# Patient Record
Sex: Male | Born: 2000
Health system: Southern US, Community
[De-identification: ages and names within clinical notes are randomized; demographics above are authoritative.]

## PROBLEM LIST (undated history)

## (undated) DIAGNOSIS — G43009 Migraine without aura, not intractable, without status migrainosus: Secondary | ICD-10-CM

## (undated) DIAGNOSIS — I10 Essential (primary) hypertension: Secondary | ICD-10-CM

## (undated) DIAGNOSIS — J302 Other seasonal allergic rhinitis: Secondary | ICD-10-CM

## (undated) HISTORY — PX: CIRCUMCISION: SUR203

## (undated) HISTORY — DX: Migraine without aura, not intractable, without status migrainosus: G43.009

## (undated) HISTORY — DX: Essential (primary) hypertension: I10

---
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
## 2018-04-11 IMAGING — CT CT ANGIO NECK
2 of 7 series · 8 of 33 positions shown · IV contrast (OMNI 350)
Comparison: Noncontrast head CT earlier today.

CLINICAL DATA: Patient fell off a treadmill. RIGHT-sided headache
with LEFT-sided weakness.

EXAM:
CT ANGIOGRAPHY HEAD AND NECK
TECHNIQUE: Multidetector CT imaging of the head and neck was performed using
the standard protocol during bolus administration of intravenous
contrast. Multiplanar CT image reconstructions and MIPs were
obtained to evaluate the vascular anatomy. Carotid stenosis
measurements (when applicable) are obtained utilizing NASCET
criteria, using the distal internal carotid diameter as the
denominator.
CONTRAST:  Isovue 370, 50 mL.

[Series 5: cta neck · axial · 0.58mm/px · z∈[-276,-152]mm · 2 of 188 slices shown]
[im 63/188  soft-tissue]
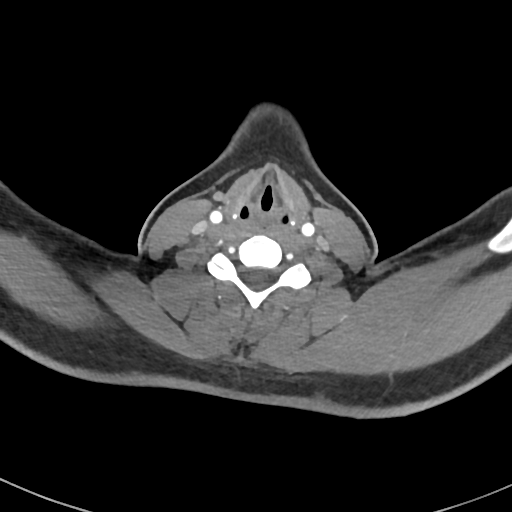
[im 125/188  soft-tissue]
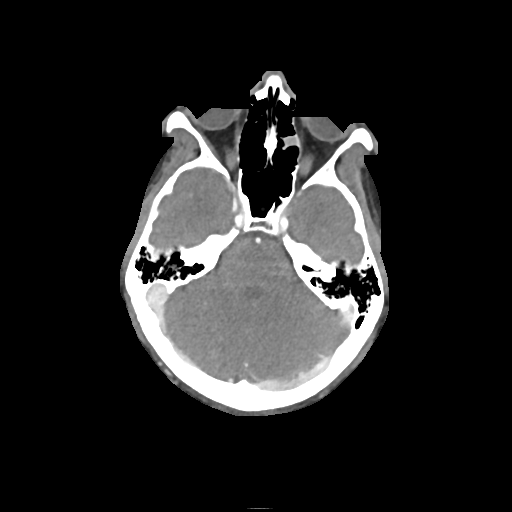

[Series 7: cta neck axial · axial · 0.39mm/px · z∈[-347,-82]mm · 6 of 372 slices shown]
[im 54/372  soft-tissue]
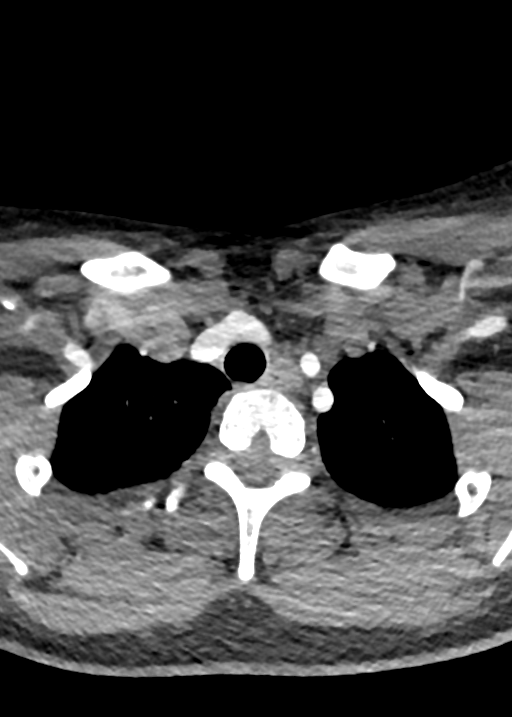
[im 107/372  bone]
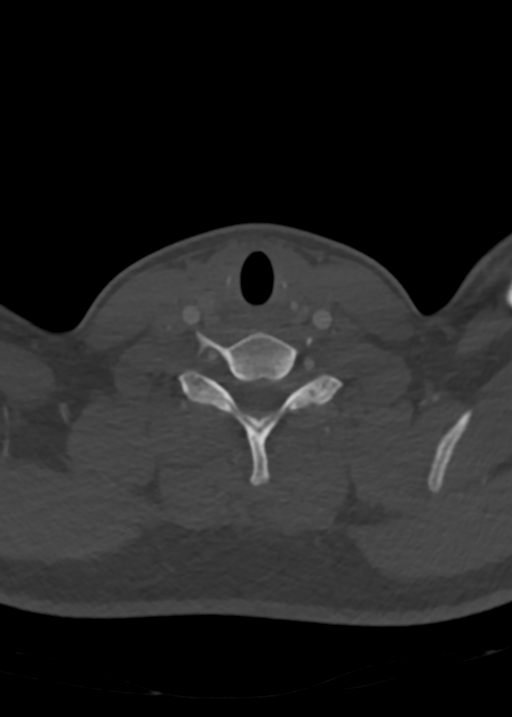
[im 160/372  soft-tissue]
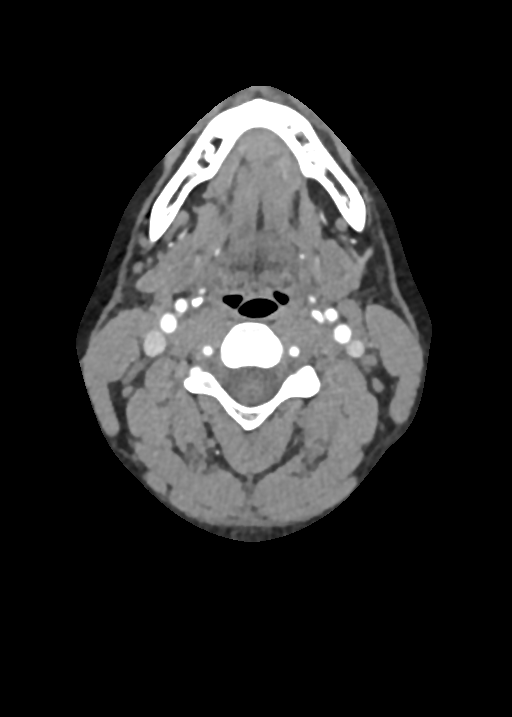
[im 213/372  bone]
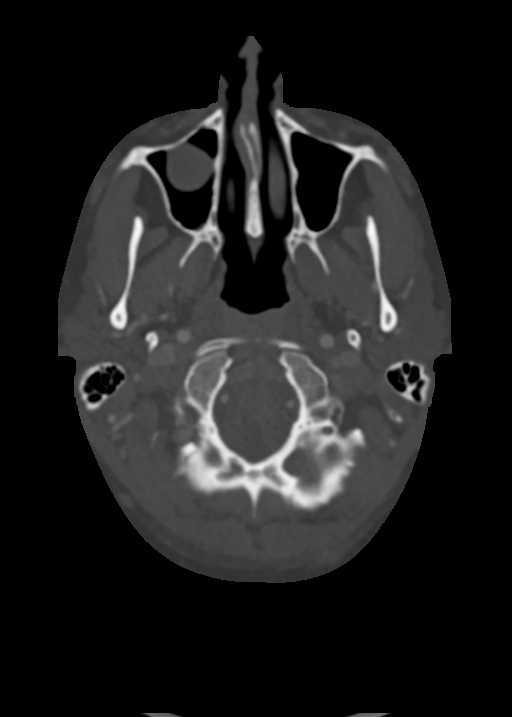
[im 266/372  soft-tissue]
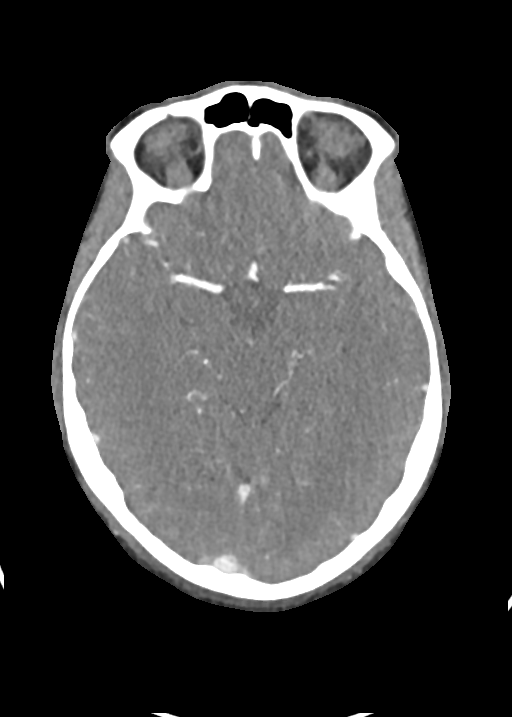
[im 319/372  bone]
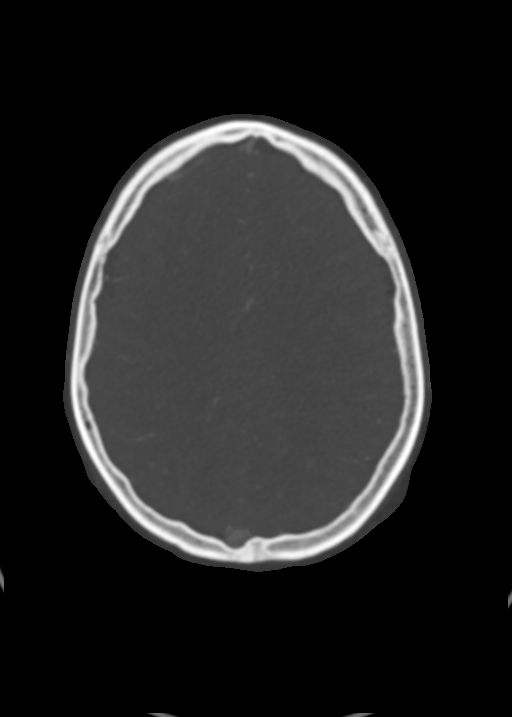

[8 of 33 positions shown; findings below may reference images not displayed]

FINDINGS: CT HEAD

Calvarium and skull base: No fracture or destructive lesion.
Mastoids and middle ears are grossly clear.

Paranasal sinuses: Imaged portions are clear.

Orbits: Negative.

Brain: No evidence of acute abnormality, including acute infarct,
hemorrhage, hydrocephalus, or mass lesion.

CTA NECK

Aortic arch: Standard branching. Imaged portion shows no evidence of
aneurysm or dissection. No significant stenosis of the major arch
vessel origins.

Right carotid system: No evidence of dissection, stenosis (50% or
greater) or occlusion.

Left carotid system: No evidence of dissection, stenosis (50% or
greater) or occlusion.

Vertebral arteries: Codominant. No evidence of dissection, stenosis
(50% or greater) or occlusion.

Nonvascular soft tissues: Normal cervical spinal alignment. No
osseous findings. Lung apices clear. No neck masses.

CTA HEAD

Anterior circulation: Moderate irregularity of the distal MCA
branches bilaterally. No significant stenosis, proximal occlusion,
aneurysm, or vascular malformation.

Posterior circulation: Moderate irregularity of distal PCA branches
bilaterally. No significant stenosis, proximal occlusion, aneurysm,
or vascular malformation.

Venous sinuses: As permitted by contrast timing, patent.

Anatomic variants: Fetal LEFT PCA.

Delayed phase:   No abnormal intracranial enhancement.
IMPRESSION: Moderate irregularity of the distal MCA and PCA branches
bilaterally. No proximal large vessel occlusion or carotid
dissection.

RCVS (reversible cerebral vasoconstrictive syndrome) not excluded.

These results were called by telephone at the time of interpretation
on 08/09/2015 at [DATE] to Dr. YOUSHIN DYAKOPU , who verbally
acknowledged these results.

## 2018-04-11 IMAGING — MR MR HEAD WO/W CM
11 of 16 series · 28 of 48 positions shown · IV contrast (multihance)
Comparison: CTA head and neck today.

CLINICAL DATA: Headache. Left-sided weakness and facial droop. Rule
out stroke.

EXAM:
MRI HEAD WITHOUT AND WITH CONTRAST
MRA HEAD WITHOUT CONTRAST
TECHNIQUE: Multiplanar, multiecho pulse sequences of the brain and surrounding
structures were obtained without and with intravenous contrast.
Angiographic images of the head were obtained using MRA technique
without contrast.
CONTRAST:  15mL MULTIHANCE GADOBENATE DIMEGLUMINE 529 MG/ML IV SOLN

[Series 3: DWI · axial · 3.6mm · 0.94mm/px · z∈[-90,+51]mm · 6 of 82 slices shown (1 of 4)]
[im 1/82]
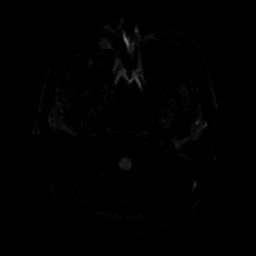
[im 17/82]
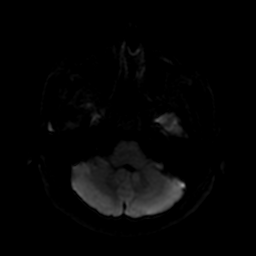
[im 33/82]
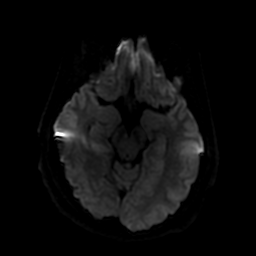
[im 49/82]
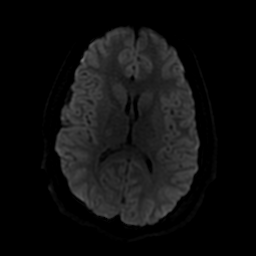
[im 65/82]
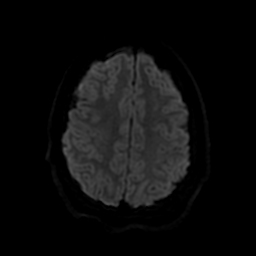
[im 82/82]
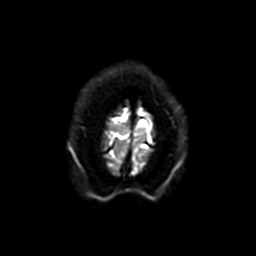

[Series 4: T2 · axial · 5.0mm · 0.47mm/px · z∈[-90,+51]mm · 2 of 25 slices shown]
[im 1/25]
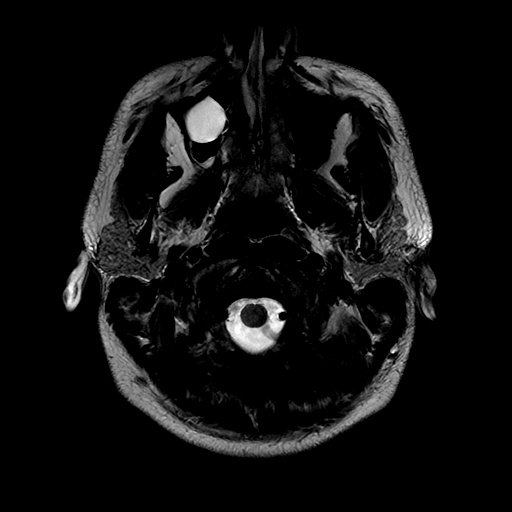
[im 25/25]
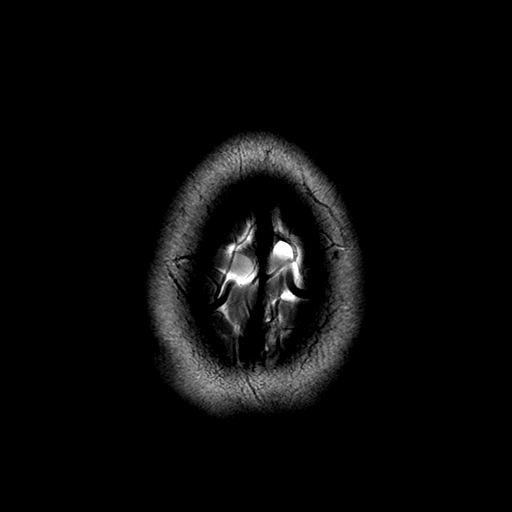

[Series 5: FLAIR · sagittal · 5.0mm · 0.47mm/px · 1 of 23 slices shown (1 of 2)]
[im 1/23]
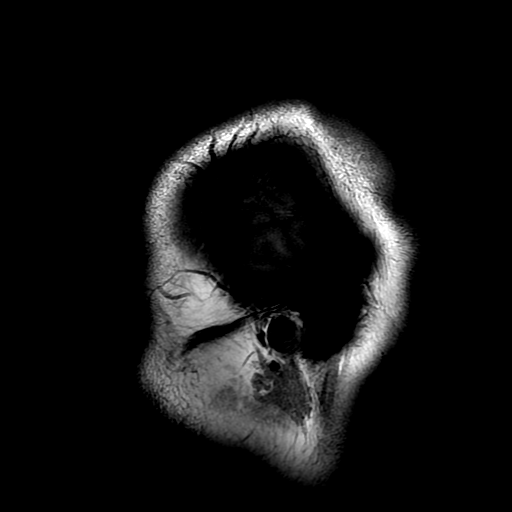

[Series 7: ax (id) 2 · axial · 1.2mm · 0.43mm/px · z∈[-93,-22]mm · 5 of 200 slices shown]
[im 1/200]
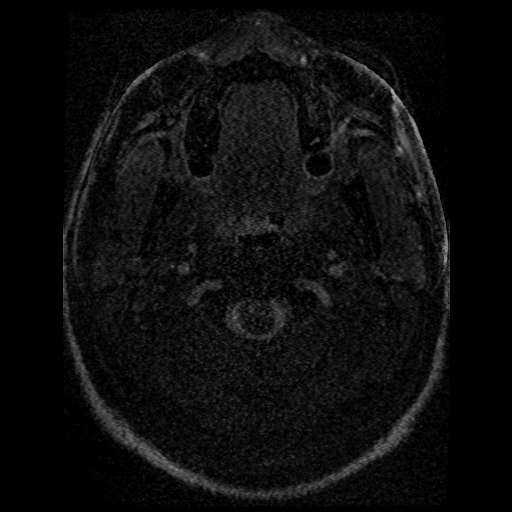
[im 40/200]
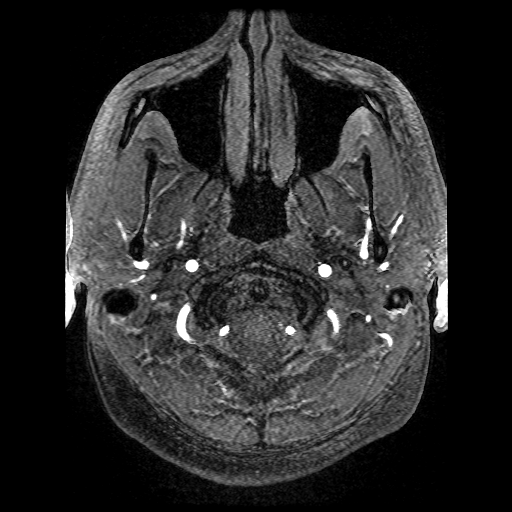
[im 60/200]
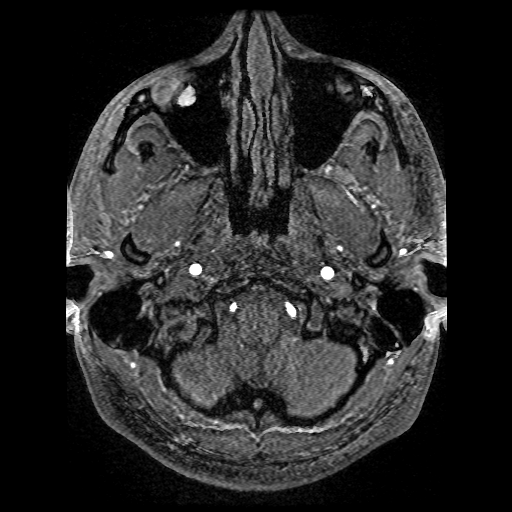
[im 80/200]
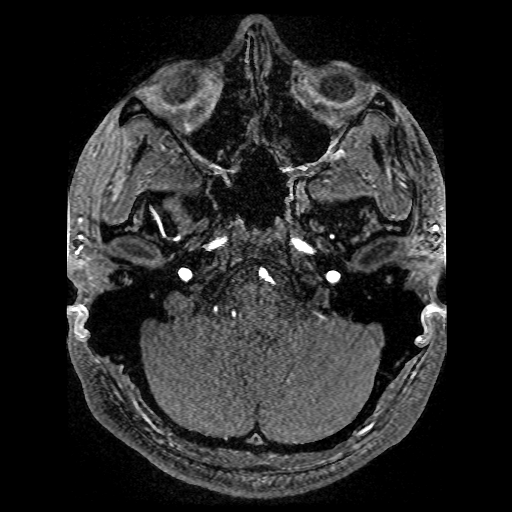
[im 120/200]
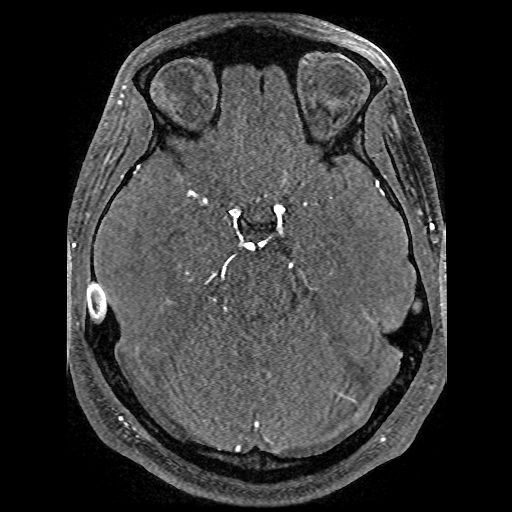

[Series 9: FLAIR · axial · 5.0mm · 0.47mm/px · 1 of 25 slices shown (2 of 2)]
[im 1/25]
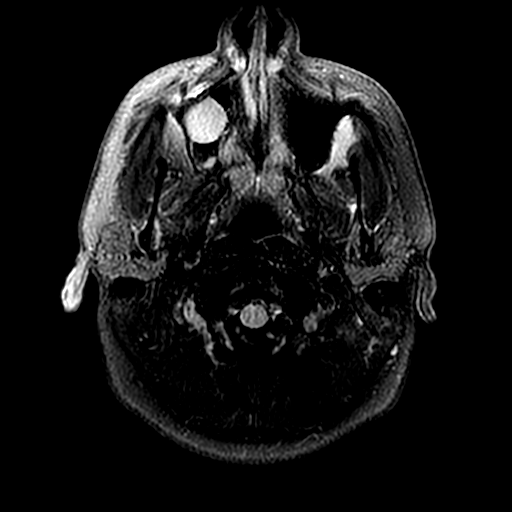

[Series 10: DWI · coronal · 5.0mm · 0.94mm/px · 4 of 74 slices shown (2 of 4)]
[im 1/74]
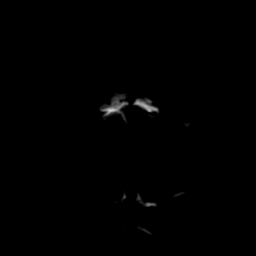
[im 25/74]
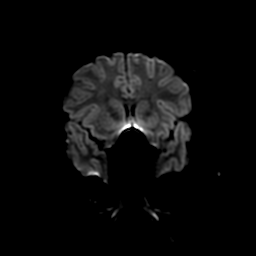
[im 49/74]
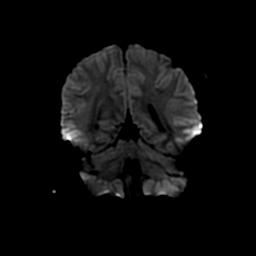
[im 74/74]
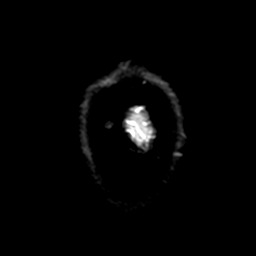

[Series 13: T2 post-contrast · coronal · 5.0mm · 0.39mm/px · 2 of 31 slices shown]
[im 1/31]
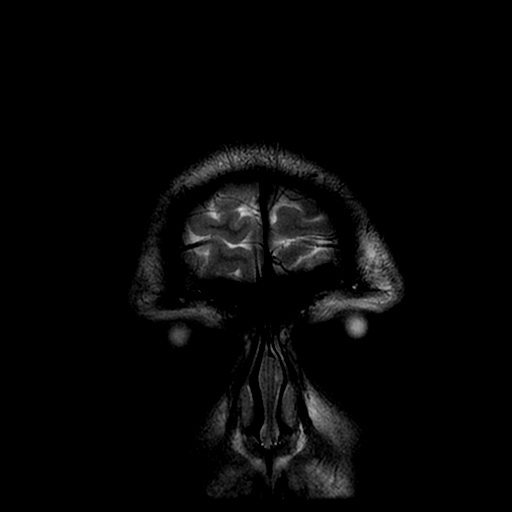
[im 31/31]
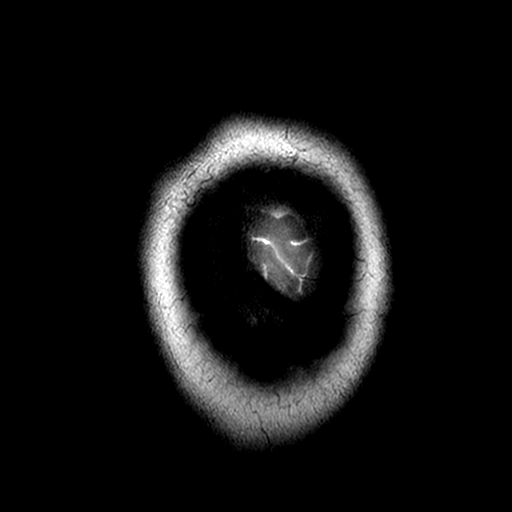

[Series 15: T1 · axial · 5.0mm · 0.47mm/px · 1 of 25 slices shown (1 of 2)]
[im 1/25]
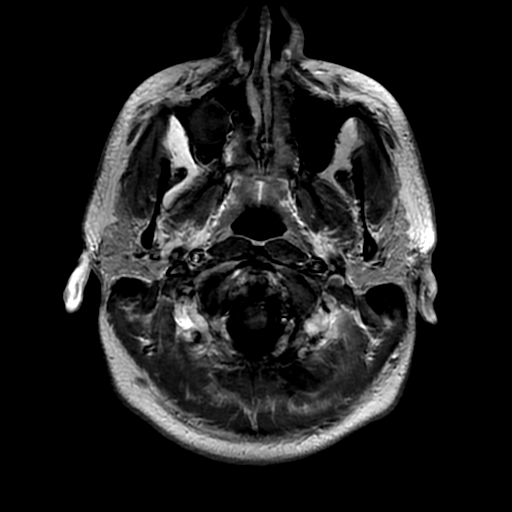

[Series 16: T1 · coronal · 5.0mm · 0.43mm/px · 2 of 31 slices shown (2 of 2)]
[im 1/31]
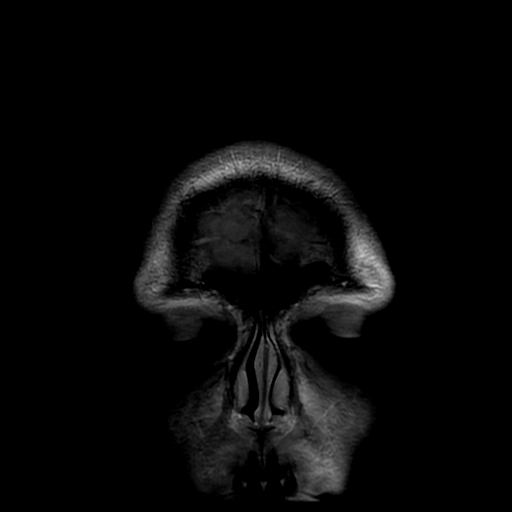
[im 31/31]
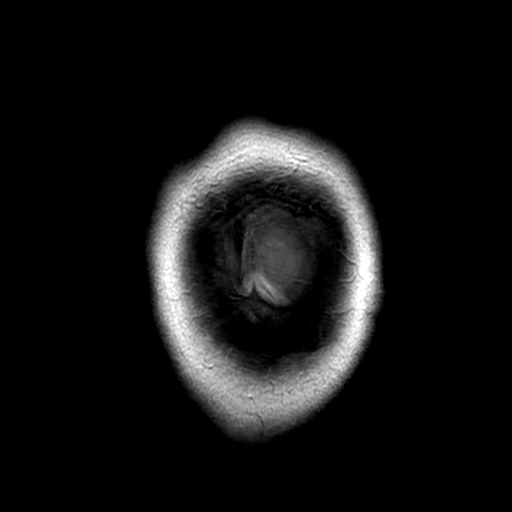

[Series 300: DWI · axial · 3.6mm · 0.94mm/px · z∈[-90,+51]mm · 2 of 41 slices shown (3 of 4)]
[im 1/41]
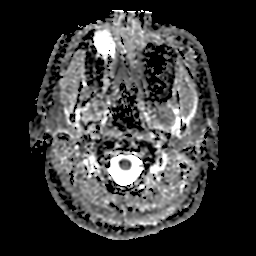
[im 41/41]
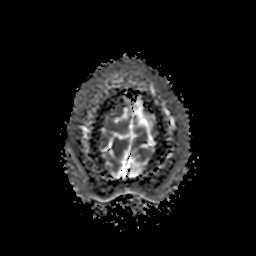

[Series 1000: DWI · coronal · 5.0mm · 0.94mm/px · 2 of 37 slices shown (4 of 4)]
[im 1/37]
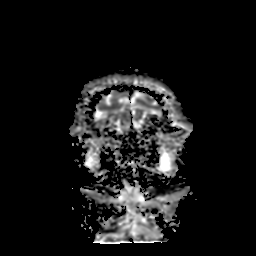
[im 37/37]
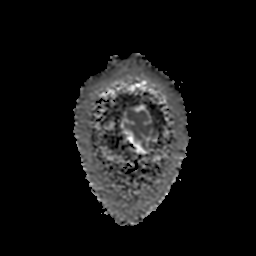

[28 of 48 positions shown; findings below may reference images not displayed]

FINDINGS: MRI HEAD FINDINGS

Ventricle size normal.  Cerebral volume normal.

Negative for acute or chronic infarction.

Negative for demyelinating disease.

Negative for intracranial hemorrhage. No subarachnoid hemorrhage
identified.

Negative for mass or edema. No cortical edema. No white matter
edema. No shift of the midline structures.

Postcontrast imaging demonstrates normal enhancement. Normal
vascular enhancement. No enhancing mass. Leptomeningeal enhancement
is normal.

Mucous retention cyst right maxillary sinus. Remaining sinuses
clear. Normal orbit and pituitary. Skull base normal.

MRA HEAD FINDINGS

Both vertebral arteries patent to the basilar. PICA patent
bilaterally. Left AICA patent. Basilar widely patent. Superior
cerebellar and posterior cerebral arteries patent bilaterally.
Posterior communicating artery patent bilaterally.

Cavernous carotid widely patent bilaterally. Anterior and middle
cerebral arteries widely patent bilaterally. Mild irregularity of
the middle cerebral artery branches noted on CTA earlier today is
not appreciated on the MRA. MRA is of good quality raising the
possibility of artifact on the CTA.

Negative for cerebral aneurysm.
IMPRESSION: Normal MRI of the brain with contrast

Normal MRA head.

## 2018-06-02 DIAGNOSIS — J069 Acute upper respiratory infection, unspecified: Secondary | ICD-10-CM | POA: Diagnosis not present

## 2018-06-02 DIAGNOSIS — J0140 Acute pansinusitis, unspecified: Secondary | ICD-10-CM | POA: Diagnosis not present

## 2018-09-08 DIAGNOSIS — M25571 Pain in right ankle and joints of right foot: Secondary | ICD-10-CM | POA: Diagnosis not present

## 2018-09-08 DIAGNOSIS — S93401A Sprain of unspecified ligament of right ankle, initial encounter: Secondary | ICD-10-CM | POA: Diagnosis not present

## 2019-02-19 DIAGNOSIS — T1501XA Foreign body in cornea, right eye, initial encounter: Secondary | ICD-10-CM | POA: Diagnosis not present

## 2019-02-20 DIAGNOSIS — S0501XD Injury of conjunctiva and corneal abrasion without foreign body, right eye, subsequent encounter: Secondary | ICD-10-CM | POA: Diagnosis not present

## 2019-04-27 NOTE — Progress Notes (Signed)
Complete Physical  Assessment and Plan:  Screening cholesterol level -     Lipid panel  Encounter for general adult medical examination with abnormal findings 1 year  Atypical migraine Has not had since 2017, had very good work up Will monitor Pt states his HR is normal 50-60's will avoid BB for now  Essential hypertension Denies symptoms of OSA, ? Abnormal brachial/femoral pulse on the right, next OV get leg blood pressures to rule out aortic coarctation.  Do dash diet, will treat with low dose losartan 50 mg Monitor BP at home -     CBC with Differential/Platelet -     COMPLETE METABOLIC PANEL WITH GFR -     TSH -     Urinalysis, Routine w reflex microscopic -     Microalbumin / creatinine urine ratio -     EKG 12-Lead -     DG Chest 2 View; Future -     losartan (COZAAR) 50 MG tablet; Take 1 tablet (50 mg total) by mouth daily.  Screening for hematuria or proteinuria Check for proteinuria with BP  Medication management -     CBC with Differential/Platelet -     COMPLETE METABOLIC PANEL WITH GFR -     TSH -     Magnesium  Vitamin D deficiency -     VITAMIN D 25 Hydroxy (Vit-D Deficiency, Fractures)  Screening, anemia, deficiency, iron -     Iron,Total/Total Iron Binding Cap -     Ferritin -     Vitamin B12  Screening for cardiovascular condition -     EKG 12-Lead  History of COVID-19 -     SAR CoV2 Serology (COVID 19)AB(IGG)IA  Need for Tdap vaccination -     Tdap vaccine greater than or equal to 7yo IM  Screening for diabetes mellitus -     Hemoglobin A1c  Plantar wart right foot Come back for removal  Discussed med's effects and SE's. Screening labs and tests as requested with regular follow-up as recommended. Over 40 minutes of exam, counseling, chart review and critical decision making was performed  HPI Patient presents to establish as a new patient.  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    He has concerns about his BP, BP has been elevated x 2017, has been 144/94.  He sleeps well at night, no frequent awaking's, no snoring, wakes up rested.  No headaches.    He complains of pain on his foot x6 months. Has place on right foot, under 2nd and 3rd toes, hard dense skin in a ring that hurts. Worse without shoes on. He tried acid OTC stuff that did not help.  He is due for his TDAP.   He has a history of atypical migraine in the past and has seen Dr. Sharene Skeans, ped neuro in 2017.  In 2017, he had left sided hemiplegia and hemiparesis with a headache, had negative head CT, CT angio neck/head, and MRI/MRA. Negative coagulation study and thrombophilia work up at that time, determined to be a complex migraine.  He had a normal B12 at that time. Normal echo.   He does workout. He denies chest pain, shortness of breath, dizziness.  He is not on cholesterol medication and denies myalgias. The cholesterol last visit was:   Lab Results  Component Value Date   CHOL 109 08/09/2015   HDL 39 (L) 08/09/2015   LDLCALC 62 08/09/2015   TRIG 41 08/09/2015  CHOLHDL 2.8 08/09/2015    Current Medications:  No current outpatient medications on file prior to visit.   No current facility-administered medications on file prior to visit.   Allergies:  Allergies  Allergen Reactions  . Penicillins Rash   Health Maintenance:   There is no immunization history on file for this patient.  Tetanus: Pneumovax: Prevnar 13: Flu vaccine: Zostavax:  DEXA: Colonoscopy: EGD: Eye Exam: Dentist:  Patient Care Team: Letitia Libra, MD as PCP - General (Pediatrics)  Medical History:  has Weakness; Other vascular headache; Acute left-sided weakness; Left sided numbness; and Atypical migraine on their problem list. Surgical History:  He  has a past surgical history that includes Circumcision. Family History:  His family history includes Cancer in his paternal grandfather and paternal  grandmother; Hyperlipidemia in his father; Hypertension in his mother; Stroke in his paternal grandfather. Social History:   reports that he has never smoked. He has never used smokeless tobacco. He reports that he does not drink alcohol or use drugs. Review of Systems:  Review of Systems  Constitutional: Negative.   HENT: Negative.   Respiratory: Negative.   Cardiovascular: Negative.   Gastrointestinal: Negative.   Genitourinary: Negative.   Musculoskeletal: Negative.   Skin: Positive for rash.  Neurological: Negative.   Psychiatric/Behavioral: Negative.     Physical Exam: Estimated body mass index is 31.38 kg/m as calculated from the following:   Height as of 08/24/15: 5' 11.25" (1.81 m).   Weight as of 08/24/15: 226 lb 9.6 oz (102.8 kg). There were no vitals taken for this visit. General Appearance: Well nourished, in no apparent distress.  Eyes: PERRLA, EOMs, conjunctiva no swelling or erythema, normal fundi and vessels.  Sinuses: No Frontal/maxillary tenderness  ENT/Mouth: Ext aud canals clear, normal light reflex with TMs without erythema, bulging. Good dentition. No erythema, swelling, or exudate on post pharynx. Tonsils not swollen or erythematous. Hearing normal.  Neck: Supple, thyroid normal. No bruits  Respiratory: Respiratory effort normal, BS equal bilaterally without rales, rhonchi, wheezing or stridor.  Cardio: RRR without murmurs, rubs or gallops. Brisk peripheral pulses without edema, delayed right TP pulse comparatively. .  Chest: symmetric, with normal excursions and percussion.  Abdomen: Soft, nontender, no guarding, rebound, hernias, masses, or organomegaly.  Lymphatics: Non tender without lymphadenopathy.  Genitourinary:  Musculoskeletal: Full ROM all peripheral extremities,5/5 strength, and normal gait.  Skin: Right foot at 2nd-3rd toe with tender circular calus. Warm, dry without rashes,  ecchymosis. Neuro: Cranial nerves intact, reflexes equal bilaterally.  Normal muscle tone, no cerebellar symptoms. Sensation intact.  Psych: Awake and oriented X 3, normal affect, Insight and Judgment appropriate.   EKG: WNL no changes. AORTA SCAN: WNL  Vicie Mutters 8:52 AM [facility]

## 2019-04-29 ENCOUNTER — Ambulatory Visit (INDEPENDENT_AMBULATORY_CARE_PROVIDER_SITE_OTHER): Payer: BC Managed Care – PPO | Admitting: Physician Assistant

## 2019-04-29 ENCOUNTER — Other Ambulatory Visit: Payer: Self-pay

## 2019-04-29 ENCOUNTER — Encounter: Payer: Self-pay | Admitting: Physician Assistant

## 2019-04-29 VITALS — BP 120/100 | HR 70 | Temp 97.9°F | Ht 74.0 in | Wt 271.0 lb

## 2019-04-29 DIAGNOSIS — I10 Essential (primary) hypertension: Secondary | ICD-10-CM | POA: Insufficient documentation

## 2019-04-29 DIAGNOSIS — Z136 Encounter for screening for cardiovascular disorders: Secondary | ICD-10-CM | POA: Diagnosis not present

## 2019-04-29 DIAGNOSIS — Z131 Encounter for screening for diabetes mellitus: Secondary | ICD-10-CM

## 2019-04-29 DIAGNOSIS — E559 Vitamin D deficiency, unspecified: Secondary | ICD-10-CM | POA: Diagnosis not present

## 2019-04-29 DIAGNOSIS — Z130 Encounter for screening for diseases of the blood and blood-forming organs and certain disorders involving the immune mechanism: Secondary | ICD-10-CM

## 2019-04-29 DIAGNOSIS — B070 Plantar wart: Secondary | ICD-10-CM

## 2019-04-29 DIAGNOSIS — Z0001 Encounter for general adult medical examination with abnormal findings: Secondary | ICD-10-CM

## 2019-04-29 DIAGNOSIS — Z8616 Personal history of COVID-19: Secondary | ICD-10-CM

## 2019-04-29 DIAGNOSIS — Z1389 Encounter for screening for other disorder: Secondary | ICD-10-CM | POA: Diagnosis not present

## 2019-04-29 DIAGNOSIS — G43009 Migraine without aura, not intractable, without status migrainosus: Secondary | ICD-10-CM

## 2019-04-29 DIAGNOSIS — Z23 Encounter for immunization: Secondary | ICD-10-CM | POA: Diagnosis not present

## 2019-04-29 DIAGNOSIS — Z13220 Encounter for screening for lipoid disorders: Secondary | ICD-10-CM

## 2019-04-29 DIAGNOSIS — Z79899 Other long term (current) drug therapy: Secondary | ICD-10-CM

## 2019-04-29 MED ORDER — LOSARTAN POTASSIUM 50 MG PO TABS: 50.0000 mg | ORAL_TABLET | Freq: Every day | ORAL | 1 refills | Status: DC

## 2019-04-29 NOTE — Patient Instructions (Addendum)
HYPERTENSION INFORMATION  Monitor your blood pressure at home, please keep a record and bring that in with you to your next office visit.   Go to the ER if any CP, SOB, nausea, dizziness, severe HA, changes vision/speech  Testing/Procedures: HOW TO TAKE YOUR BLOOD PRESSURE:  Rest 5 minutes before taking your blood pressure.  Don't smoke or drink caffeinated beverages for at least 30 minutes before.  Take your blood pressure before (not after) you eat.  Sit comfortably with your back supported and both feet on the floor (don't cross your legs).  Elevate your arm to heart level on a table or a desk.  Use the proper sized cuff. It should fit smoothly and snugly around your bare upper arm. There should be enough room to slip a fingertip under the cuff. The bottom edge of the cuff should be 1 inch above the crease of the elbow.  Due to a recent study, SPRINT, we have changed our goal for the systolic or top blood pressure number. Ideally we want your top number at 120.  In the [facility] Trial, 5000 people were randomized to a goal BP of 120 and 5000 people were randomized to a goal BP of less than 140. The patients with the goal BP at 120 had LESS DEMENTIA, LESS HEART ATTACKS, AND LESS STROKES, AS WELL AS OVERALL DECREASED MORTALITY OR DEATH RATE.   There was another study that showed taking your blood pressure medications at night decrease cardiovascular events.  However if you are on a fluid pill, please take this in the morning.   If you are willing, our goal BP is the top number of 120.  Your most recent BP: BP: (!) 120/100   Take your medications faithfully as instructed. Maintain a healthy weight. Get at least 150 minutes of aerobic exercise per week. Minimize salt intake. Minimize alcohol intake  DASH Eating Plan DASH stands for "Dietary Approaches to Stop Hypertension." The DASH eating plan is a healthy eating plan that has been shown to reduce high blood pressure (hypertension).  Additional health benefits may include reducing the risk of type 2 diabetes mellitus, heart disease, and stroke. The DASH eating plan may also help with weight loss. WHAT DO I NEED TO KNOW ABOUT THE DASH EATING PLAN? For the DASH eating plan, you will follow these general guidelines:  Choose foods with a percent daily value for sodium of less than 5% (as listed on the food label).  Use salt-free seasonings or herbs instead of table salt or sea salt.  Check with your health care provider or pharmacist before using salt substitutes.  Eat lower-sodium products, often labeled as "lower sodium" or "no salt added."  Eat fresh foods.  Eat more vegetables, fruits, and low-fat dairy products.  Choose whole grains. Look for the word "whole" as the first word in the ingredient list.  Choose fish and skinless chicken or [place] more often than red meat. Limit fish, poultry, and meat to 6 oz (170 g) each day.  Limit sweets, desserts, sugars, and sugary drinks.  Choose heart-healthy fats.  Limit cheese to 1 oz (28 g) per day.  Eat more home-cooked food and less restaurant, buffet, and fast food.  Limit fried foods.  Cook foods using methods other than frying.  Limit canned vegetables. If you do use them, rinse them well to decrease the sodium.  When eating at a restaurant, ask that your food be prepared with less salt, or no salt if possible. WHAT FOODS  CAN I EAT? Seek help from a dietitian for individual calorie needs. Grains Whole grain or whole wheat bread. Brown rice. Whole grain or whole wheat pasta. Quinoa, bulgur, and whole grain cereals. Low-sodium cereals. Corn or whole wheat flour tortillas. Whole grain cornbread. Whole grain crackers. Low-sodium crackers. Vegetables Fresh or frozen vegetables (raw, steamed, roasted, or grilled). Low-sodium or reduced-sodium tomato and vegetable juices. Low-sodium or reduced-sodium tomato sauce and paste. Low-sodium or reduced-sodium canned  vegetables.  Fruits All fresh, canned (in natural juice), or frozen fruits. Meat and Other Protein Products Ground beef (85% or leaner), grass-fed beef, or beef trimmed of fat. Skinless chicken or [place]. Ground chicken or [place]. Pork trimmed of fat. All fish and seafood. Eggs. Dried beans, peas, or lentils. Unsalted nuts and seeds. Unsalted canned beans. Dairy Low-fat dairy products, such as skim or 1% milk, 2% or reduced-fat cheeses, low-fat ricotta or cottage cheese, or plain low-fat yogurt. Low-sodium or reduced-sodium cheeses. Fats and Oils Tub margarines without trans fats. Light or reduced-fat mayonnaise and salad dressings (reduced sodium). Avocado. Safflower, olive, or canola oils. Natural peanut or almond butter. Other Unsalted popcorn and pretzels. The items listed above may not be a complete list of recommended foods or beverages. Contact your dietitian for more options. WHAT FOODS ARE NOT RECOMMENDED? Grains White bread. White pasta. White rice. Refined cornbread. Bagels and croissants. Crackers that contain trans fat. Vegetables Creamed or fried vegetables. Vegetables in a cheese sauce. Regular canned vegetables. Regular canned tomato sauce and paste. Regular tomato and vegetable juices. Fruits Dried fruits. Canned fruit in light or heavy syrup. Fruit juice. Meat and Other Protein Products Fatty cuts of meat. Ribs, chicken wings, bacon, sausage, bologna, salami, chitterlings, fatback, hot dogs, bratwurst, and packaged luncheon meats. Salted nuts and seeds. Canned beans with salt. Dairy Whole or 2% milk, cream, half-and-half, and cream cheese. Whole-fat or sweetened yogurt. Full-fat cheeses or blue cheese. Nondairy creamers and whipped toppings. Processed cheese, cheese spreads, or cheese curds. Condiments Onion and garlic salt, seasoned salt, table salt, and sea salt. Canned and packaged gravies. Worcestershire sauce. Tartar sauce. Barbecue sauce. Teriyaki sauce. Soy sauce,  including reduced sodium. Steak sauce. Fish sauce. Oyster sauce. Cocktail sauce. Horseradish. Ketchup and mustard. Meat flavorings and tenderizers. Bouillon cubes. Hot sauce. Tabasco sauce. Marinades. Taco seasonings. Relishes. Fats and Oils Butter, stick margarine, lard, shortening, ghee, and bacon fat. Coconut, palm kernel, or palm oils. Regular salad dressings. Other Pickles and olives. Salted popcorn and pretzels. The items listed above may not be a complete list of foods and beverages to avoid. Contact your dietitian for more information. WHERE CAN I FIND MORE INFORMATION? National Heart, Lung, and Blood Institute: [employer] Document Released: 03/08/2011 Document Revised: 08/03/2013 Document Reviewed: 01/21/2013 [facility] Patient Information 2015 [place], [place]. This information is not intended to replace advice given to you by your health care provider. Make sure you discuss any questions you have with your health care provider.  Plantar Warts Warts are small growths on the skin. When they occur on the underside (sole) of the foot, they are called plantar warts. Plantar warts often occur in groups, with several small warts around a larger wart. They tend to develop on the heel or the ball of the foot. They may grow into the deeper layers of skin or rise above the surface of the skin. Most warts are not painful, and they usually do not cause problems. However, plantar warts may cause pain when you walk because pressure is applied to them. Plantar  warts may spread to other areas of the sole. They can also spread to other areas of the body through direct and indirect contact. Warts often go away on their own in time. Various treatments may be done if needed or desired. What are the causes? Plantar warts are caused by a type of virus that is called human papillomavirus (HPV).  Walking barefoot can cause exposure to the virus, especially if your feet are  wet.  HPV attacks a break in the skin of the foot. What increases the risk? You are more likely to develop this condition if you:  Are between 9-80 years of age.  Use public showers or locker rooms.  Have a weakened body defense system (immune system). What are the signs or symptoms? Common symptoms of this condition include:  Flat or slightly raised growths that have a rough surface and look similar to a callus.  Pain when you use your foot to support your body weight. How is this diagnosed? A plantar wart can usually be diagnosed from its appearance. In some cases, a tissue sample may be removed (biopsy) to be looked at under a microscope. How is this treated? In many cases, warts do not need treatment. Without treatment, they often go away with time. If treatment is needed or desired, options may include:  Applying medicated solutions, creams, or patches to the wart. These may be over-the-counter or prescription medicines that make the skin soft so that layers will gradually shed away. In many cases, the medicine is applied one or two times a day and covered with a bandage.  Freezing the wart with liquid nitrogen (cryotherapy).  Burning the wart with: ? Laser treatment. ? An electrified probe (electrocautery).  Injecting a medicine (Candida antigen) into the wart to help the body's immune system fight off the wart.  Having surgery to remove the wart.  Putting duct tape over the top of the wart (occlusion). You will leave the tape in place for as long as told by your health care provider, and then you will replace it with a new strip of tape. This is done until the wart goes away. Repeat treatment may be needed if you choose to remove warts. Warts sometimes go away and come back again. Follow these instructions at home:  Apply medicated creams or solutions only as told by your health care provider. This may involve: ? Soaking the affected area in warm water. ? Removing the  top layer of softened skin before you apply the medicine. A pumice stone works well for removing the skin. ? Applying a bandage over the affected area after you apply the medicine. ? Repeating the process daily or as told by your health care provider.  Do not scratch or pick at a wart.  Wash your hands after you touch a wart.  If a wart is painful, try covering it with a bandage that has a hole in the middle. This helps to take pressure off the wart.  Keep all follow-up visits as told by your health care provider. This is important. How is this prevented? Take these actions to help prevent warts:  Wear shoes and socks. Change your socks daily.  Keep your feet clean and dry.  Do not walk barefoot in shared locker rooms, shower areas, or swimming pools.  Check your feet regularly.  Avoid direct contact with warts on other people. Contact a health care provider if:  Your warts do not improve after treatment.  You have redness, swelling,  or pain at the site of a wart.  You have bleeding from a wart that does not stop with light pressure.  You have diabetes and you develop a wart. Summary  Warts are small growths on the skin. When they occur on the underside (sole) of the foot, they are called plantar warts.  In many cases, warts do not need treatment. Without treatment, they often go away with time.  Apply medicated creams or solutions only as told by your health care provider.  Do not scratch or pick at a wart. Wash your hands after you touch a wart.  Keep all follow-up visits as told by your health care provider. This is important. This information is not intended to replace advice given to you by your health care provider. Make sure you discuss any questions you have with your health care provider. Document Revised: 10/15/2017 Document Reviewed: 10/15/2017 Elsevier Patient Education  2020 [employer].

## 2019-04-30 DIAGNOSIS — Z79899 Other long term (current) drug therapy: Secondary | ICD-10-CM | POA: Insufficient documentation

## 2019-04-30 DIAGNOSIS — E559 Vitamin D deficiency, unspecified: Secondary | ICD-10-CM | POA: Insufficient documentation

## 2019-04-30 LAB — IRON, TOTAL/TOTAL IRON BINDING CAP
%SAT: 13 % (calc) — ABNORMAL LOW (ref 20–48)
Iron: 47 ug/dL — ABNORMAL LOW (ref 50–195)
TIBC: 355 mcg/dL (calc) (ref 250–425)

## 2019-04-30 LAB — CBC WITH DIFFERENTIAL/PLATELET
Absolute Monocytes: 1090 cells/uL — ABNORMAL HIGH (ref 200–950)
Basophils Absolute: 65 cells/uL (ref 0–200)
Basophils Relative: 0.6 %
Eosinophils Absolute: 185 cells/uL (ref 15–500)
Eosinophils Relative: 1.7 %
HCT: 46.6 % (ref 38.5–50.0)
Hemoglobin: 16.2 g/dL (ref 13.2–17.1)
Lymphs Abs: 2998 cells/uL (ref 850–3900)
MCH: 30.4 pg (ref 27.0–33.0)
MCHC: 34.8 g/dL (ref 32.0–36.0)
MCV: 87.4 fL (ref 80.0–100.0)
MPV: 10.7 fL (ref 7.5–12.5)
Monocytes Relative: 10 %
Neutro Abs: 6562 cells/uL (ref 1500–7800)
Neutrophils Relative %: 60.2 %
Platelets: 309 10*3/uL (ref 140–400)
RBC: 5.33 10*6/uL (ref 4.20–5.80)
RDW: 12.2 % (ref 11.0–15.0)
Total Lymphocyte: 27.5 %
WBC: 10.9 10*3/uL — ABNORMAL HIGH (ref 3.8–10.8)

## 2019-04-30 LAB — COMPLETE METABOLIC PANEL WITH GFR
AG Ratio: 1.9 (calc) (ref 1.0–2.5)
ALT: 32 U/L (ref 9–46)
AST: 24 U/L (ref 10–40)
Albumin: 5 g/dL (ref 3.6–5.1)
Alkaline phosphatase (APISO): 69 U/L (ref 36–130)
BUN: 10 mg/dL (ref 7–25)
CO2: 28 mmol/L (ref 20–32)
Calcium: 10.1 mg/dL (ref 8.6–10.3)
Chloride: 102 mmol/L (ref 98–110)
Creat: 0.93 mg/dL (ref 0.60–1.35)
GFR, Est African American: 136 mL/min/{1.73_m2} (ref >=60)
GFR, Est Non African American: 118 mL/min/{1.73_m2} (ref >=60)
Globulin: 2.6 g/dL (calc) (ref 1.9–3.7)
Glucose, Bld: 85 mg/dL (ref 65–99)
Potassium: 4.1 mmol/L (ref 3.5–5.3)
Sodium: 141 mmol/L (ref 135–146)
Total Bilirubin: 0.5 mg/dL (ref 0.2–1.2)
Total Protein: 7.6 g/dL (ref 6.1–8.1)

## 2019-04-30 LAB — HEMOGLOBIN A1C
Hgb A1c MFr Bld: 5.1 % of total Hgb (ref <5.7)
Mean Plasma Glucose: 100 (calc)
eAG (mmol/L): 5.5 (calc)

## 2019-04-30 LAB — URINALYSIS, ROUTINE W REFLEX MICROSCOPIC
Bilirubin Urine: NEGATIVE
Glucose, UA: NEGATIVE
Hgb urine dipstick: NEGATIVE
Ketones, ur: NEGATIVE
Leukocytes,Ua: NEGATIVE
Nitrite: NEGATIVE
Protein, ur: NEGATIVE
Specific Gravity, Urine: 1.016 (ref 1.001–1.03)
pH: 7 (ref 5.0–8.0)

## 2019-04-30 LAB — LIPID PANEL
Cholesterol: 133 mg/dL (ref <200)
HDL: 46 mg/dL (ref >=40)
LDL Cholesterol (Calc): 68 mg/dL (calc)
Non-HDL Cholesterol (Calc): 87 mg/dL (calc) (ref <130)
Total CHOL/HDL Ratio: 2.9 (calc) (ref <5.0)
Triglycerides: 108 mg/dL (ref <150)

## 2019-04-30 LAB — MICROALBUMIN / CREATININE URINE RATIO
Creatinine, Urine: 136 mg/dL (ref 20–320)
Microalb Creat Ratio: 5 mcg/mg creat (ref <30)
Microalb, Ur: 0.7 mg/dL

## 2019-04-30 LAB — FERRITIN: Ferritin: 136 ng/mL (ref 38–380)

## 2019-04-30 LAB — MAGNESIUM: Magnesium: 2.2 mg/dL (ref 1.5–2.5)

## 2019-04-30 LAB — SAR COV2 SEROLOGY (COVID19)AB(IGG),IA: SARS CoV2 AB IGG: NEGATIVE

## 2019-04-30 LAB — VITAMIN B12: Vitamin B-12: 548 pg/mL (ref 200–1100)

## 2019-04-30 LAB — TSH: TSH: 3.21 mIU/L (ref 0.40–4.50)

## 2019-04-30 LAB — VITAMIN D 25 HYDROXY (VIT D DEFICIENCY, FRACTURES): Vit D, 25-Hydroxy: 34 ng/mL (ref 30–100)

## 2019-05-11 ENCOUNTER — Ambulatory Visit (INDEPENDENT_AMBULATORY_CARE_PROVIDER_SITE_OTHER): Payer: BC Managed Care – PPO | Admitting: Physician Assistant

## 2019-05-11 ENCOUNTER — Other Ambulatory Visit: Payer: Self-pay

## 2019-05-11 VITALS — BP 146/88 | HR 88 | Temp 97.0°F | Resp 16 | Ht 74.0 in | Wt 268.6 lb

## 2019-05-11 DIAGNOSIS — B070 Plantar wart: Secondary | ICD-10-CM

## 2019-05-11 DIAGNOSIS — I10 Essential (primary) hypertension: Secondary | ICD-10-CM | POA: Diagnosis not present

## 2019-05-11 MED ORDER — LOSARTAN POTASSIUM 100 MG PO TABS: 100.0000 mg | ORAL_TABLET | Freq: Every day | ORAL | 1 refills | Status: DC

## 2019-05-11 NOTE — Patient Instructions (Addendum)
Increase losartan to 100mg , take 2 of the 50 mg until you run out and then get the 100mg  and take once a day.    Plantar Warts Warts are small growths on the skin. When they occur on the underside (sole) of the foot, they are called plantar warts. Plantar warts often occur in groups, with several small warts around a larger wart. They tend to develop on the heel or the ball of the foot. They may grow into the deeper layers of skin or rise above the surface of the skin. Most warts are not painful, and they usually do not cause problems. However, plantar warts may cause pain when you walk because pressure is applied to them. Plantar warts may spread to other areas of the sole. They can also spread to other areas of the body through direct and indirect contact. Warts often go away on their own in time. Various treatments may be done if needed or desired. What are the causes? Plantar warts are caused by a type of virus that is called human papillomavirus (HPV).  Walking barefoot can cause exposure to the virus, especially if your feet are wet.  HPV attacks a break in the skin of the foot. What increases the risk? You are more likely to develop this condition if you:  Are between 9-30 years of age.  Use public showers or locker rooms.  Have a weakened body defense system (immune system). What are the signs or symptoms? Common symptoms of this condition include:  Flat or slightly raised growths that have a rough surface and look similar to a callus.  Pain when you use your foot to support your body weight. How is this diagnosed? A plantar wart can usually be diagnosed from its appearance. In some cases, a tissue sample may be removed (biopsy) to be looked at under a microscope. How is this treated? In many cases, warts do not need treatment. Without treatment, they often go away with time. If treatment is needed or desired, options may include:  Applying medicated solutions, creams, or  patches to the wart. These may be over-the-counter or prescription medicines that make the skin soft so that layers will gradually shed away. In many cases, the medicine is applied one or two times a day and covered with a bandage.  Freezing the wart with liquid nitrogen (cryotherapy).  Burning the wart with: ? Laser treatment. ? An electrified probe (electrocautery).  Injecting a medicine (Candida antigen) into the wart to help the body's immune system fight off the wart.  Having surgery to remove the wart.  Putting duct tape over the top of the wart (occlusion). You will leave the tape in place for as long as told by your health care provider, and then you will replace it with a new strip of tape. This is done until the wart goes away. Repeat treatment may be needed if you choose to remove warts. Warts sometimes go away and come back again. Follow these instructions at home:  Apply medicated creams or solutions only as told by your health care provider. This may involve: ? Soaking the affected area in warm water. ? Removing the top layer of softened skin before you apply the medicine. A pumice stone works well for removing the skin. ? Applying a bandage over the affected area after you apply the medicine. ? Repeating the process daily or as told by your health care provider.  Do not scratch or pick at a wart.  Wash your  hands after you touch a wart.  If a wart is painful, try covering it with a bandage that has a hole in the middle. This helps to take pressure off the wart.  Keep all follow-up visits as told by your health care provider. This is important. How is this prevented? Take these actions to help prevent warts:  Wear shoes and socks. Change your socks daily.  Keep your feet clean and dry.  Do not walk barefoot in shared locker rooms, shower areas, or swimming pools.  Check your feet regularly.  Avoid direct contact with warts on other people. Contact a health care  provider if:  Your warts do not improve after treatment.  You have redness, swelling, or pain at the site of a wart.  You have bleeding from a wart that does not stop with light pressure.  You have diabetes and you develop a wart. Summary  Warts are small growths on the skin. When they occur on the underside (sole) of the foot, they are called plantar warts.  In many cases, warts do not need treatment. Without treatment, they often go away with time.  Apply medicated creams or solutions only as told by your health care provider.  Do not scratch or pick at a wart. Wash your hands after you touch a wart.  Keep all follow-up visits as told by your health care provider. This is important. This information is not intended to replace advice given to you by your health care provider. Make sure you discuss any questions you have with your health care provider. Document Revised: 10/15/2017 Document Reviewed: 10/15/2017 Elsevier Patient Education  [place] INFORMATION  Monitor your blood pressure at home, please keep a record and bring that in with you to your next office visit.   Go to the ER if any CP, SOB, nausea, dizziness, severe HA, changes vision/speech  Testing/Procedures: HOW TO TAKE YOUR BLOOD PRESSURE:  Rest 5 minutes before taking your blood pressure.  Don't smoke or drink caffeinated beverages for at least 30 minutes before.  Take your blood pressure before (not after) you eat.  Sit comfortably with your back supported and both feet on the floor (don't cross your legs).  Elevate your arm to heart level on a table or a desk.  Use the proper sized cuff. It should fit smoothly and snugly around your bare upper arm. There should be enough room to slip a fingertip under the cuff. The bottom edge of the cuff should be 1 inch above the crease of the elbow.  Due to a recent study, SPRINT, we have changed our goal for the systolic or top blood  pressure number. Ideally we want your top number at 120.  In the [facility] Trial, 5000 people were randomized to a goal BP of 120 and 5000 people were randomized to a goal BP of less than 140. The patients with the goal BP at 120 had LESS DEMENTIA, LESS HEART ATTACKS, AND LESS STROKES, AS WELL AS OVERALL DECREASED MORTALITY OR DEATH RATE.   There was another study that showed taking your blood pressure medications at night decrease cardiovascular events.  However if you are on a fluid pill, please take this in the morning.   If you are willing, our goal BP is the top number of 120.  Your most recent BP: BP: (!) 146/88   Take your medications faithfully as instructed. Maintain a healthy weight. Get at least 150 minutes of aerobic exercise per  week. Minimize salt intake. Minimize alcohol intake  DASH Eating Plan DASH stands for "Dietary Approaches to Stop Hypertension." The DASH eating plan is a healthy eating plan that has been shown to reduce high blood pressure (hypertension). Additional health benefits may include reducing the risk of type 2 diabetes mellitus, heart disease, and stroke. The DASH eating plan may also help with weight loss. WHAT DO I NEED TO KNOW ABOUT THE DASH EATING PLAN? For the DASH eating plan, you will follow these general guidelines:  Choose foods with a percent daily value for sodium of less than 5% (as listed on the food label).  Use salt-free seasonings or herbs instead of table salt or sea salt.  Check with your health care provider or pharmacist before using salt substitutes.  Eat lower-sodium products, often labeled as "lower sodium" or "no salt added."  Eat fresh foods.  Eat more vegetables, fruits, and low-fat dairy products.  Choose whole grains. Look for the word "whole" as the first word in the ingredient list.  Choose fish and skinless chicken or [place] more often than red meat. Limit fish, poultry, and meat to 6 oz (170 g) each day.  Limit sweets,  desserts, sugars, and sugary drinks.  Choose heart-healthy fats.  Limit cheese to 1 oz (28 g) per day.  Eat more home-cooked food and less restaurant, buffet, and fast food.  Limit fried foods.  Cook foods using methods other than frying.  Limit canned vegetables. If you do use them, rinse them well to decrease the sodium.  When eating at a restaurant, ask that your food be prepared with less salt, or no salt if possible. WHAT FOODS CAN I EAT? Seek help from a dietitian for individual calorie needs. Grains Whole grain or whole wheat bread. Brown rice. Whole grain or whole wheat pasta. Quinoa, bulgur, and whole grain cereals. Low-sodium cereals. Corn or whole wheat flour tortillas. Whole grain cornbread. Whole grain crackers. Low-sodium crackers. Vegetables Fresh or frozen vegetables (raw, steamed, roasted, or grilled). Low-sodium or reduced-sodium tomato and vegetable juices. Low-sodium or reduced-sodium tomato sauce and paste. Low-sodium or reduced-sodium canned vegetables.  Fruits All fresh, canned (in natural juice), or frozen fruits. Meat and Other Protein Products Ground beef (85% or leaner), grass-fed beef, or beef trimmed of fat. Skinless chicken or [place]. Ground chicken or [place]. Pork trimmed of fat. All fish and seafood. Eggs. Dried beans, peas, or lentils. Unsalted nuts and seeds. Unsalted canned beans. Dairy Low-fat dairy products, such as skim or 1% milk, 2% or reduced-fat cheeses, low-fat ricotta or cottage cheese, or plain low-fat yogurt. Low-sodium or reduced-sodium cheeses. Fats and Oils Tub margarines without trans fats. Light or reduced-fat mayonnaise and salad dressings (reduced sodium). Avocado. Safflower, olive, or canola oils. Natural peanut or almond butter. Other Unsalted popcorn and pretzels. The items listed above may not be a complete list of recommended foods or beverages. Contact your dietitian for more options. WHAT FOODS ARE NOT  RECOMMENDED? Grains White bread. White pasta. White rice. Refined cornbread. Bagels and croissants. Crackers that contain trans fat. Vegetables Creamed or fried vegetables. Vegetables in a cheese sauce. Regular canned vegetables. Regular canned tomato sauce and paste. Regular tomato and vegetable juices. Fruits Dried fruits. Canned fruit in light or heavy syrup. Fruit juice. Meat and Other Protein Products Fatty cuts of meat. Ribs, chicken wings, bacon, sausage, bologna, salami, chitterlings, fatback, hot dogs, bratwurst, and packaged luncheon meats. Salted nuts and seeds. Canned beans with salt. Dairy Whole or 2% milk, cream,  half-and-half, and cream cheese. Whole-fat or sweetened yogurt. Full-fat cheeses or blue cheese. Nondairy creamers and whipped toppings. Processed cheese, cheese spreads, or cheese curds. Condiments Onion and garlic salt, seasoned salt, table salt, and sea salt. Canned and packaged gravies. Worcestershire sauce. Tartar sauce. Barbecue sauce. Teriyaki sauce. Soy sauce, including reduced sodium. Steak sauce. Fish sauce. Oyster sauce. Cocktail sauce. Horseradish. Ketchup and mustard. Meat flavorings and tenderizers. Bouillon cubes. Hot sauce. Tabasco sauce. Marinades. Taco seasonings. Relishes. Fats and Oils Butter, stick margarine, lard, shortening, ghee, and bacon fat. Coconut, palm kernel, or palm oils. Regular salad dressings. Other Pickles and olives. Salted popcorn and pretzels. The items listed above may not be a complete list of foods and beverages to avoid. Contact your dietitian for more information. WHERE CAN I FIND MORE INFORMATION? National Heart, Lung, and Blood Institute: [employer] Document Released: 03/08/2011 Document Revised: 08/03/2013 Document Reviewed: 01/21/2013 [facility] Patient Information 2015 [place], [place]. This information is not intended to replace advice given to you by your health care provider. Make  sure you discuss any questions you have with your health care provider.

## 2019-05-11 NOTE — Progress Notes (Signed)
Subjective:    Patient ID: Keith Singh, male    DOB: 2000/06/12, 19 y.o.   MRN: [id number]  HPI 19 y.o. WM presents for follow up for BP and plantar wart removal.   He was started on losartan 50 mg last visit, 130-80's at his house.  BP Readings from Last 3 Encounters:  05/11/19 (!) 146/88  04/29/19 (!) 120/100  08/24/15 108/82 (19 %, Z = -0.88 /  90 %, Z = 1.27)*   *BP percentiles are based on the 2017 AAP Clinical Practice Guideline for boys   He started on a vitamin D supplement.  States the iron gave him constipation, will try every other day or increase greens.  Lab Results  Component Value Date   IRON 47 (L) 04/29/2019   TIBC 355 04/29/2019   FERRITIN 136 04/29/2019     Blood pressure (!) 146/88, pulse 88, temperature (!) 97 F (36.1 C), resp. rate 16, height 6\' 2"  (1.88 m), weight 268 lb 9.6 oz (121.8 kg).  Medications   Current Outpatient Medications (Cardiovascular):  .  losartan (COZAAR) 50 MG tablet, Take 1 tablet (50 mg total) by mouth daily.     Current Outpatient Medications (Other):   Ascorbic Acid (VITAMIN C) 500 MG CAPS, Take by mouth. .  Black Pepper-Turmeric (TURMERIC COMPLEX/BLACK PEPPER) 3-500 MG CAPS, Take by mouth. .  Garlic 300 MG TABS, Take by mouth. .  Magnesium 200 MG TABS, Take by mouth. .  Multiple Vitamin (MULTIVITAMIN) tablet, Take by mouth daily. .  Potassium 99 MG TABS, Take by mouth. .  Probiotic Product (PROBIOTIC PO), Take by mouth. .  SELENIUM PO, Take by mouth. Marland Kitchen  VITAMIN D PO, Take 5,000 Units by mouth. .  vitamin E 1000 UNIT capsule, Take 268 Units by mouth daily. .  Zinc 30 MG TABS, Take by mouth.  Problem list He has Atypical migraine; Hypertension; Medication management; and Vitamin D deficiency on their problem list.   Review of Systems See HPI  Objective:   Physical Exam General Appearance: Well nourished, in no apparent distress.  ENT/Mouth:Mouth and nose not examined- patient wearing a  facemask Respiratory: Respiratory effort normal, BS equal bilaterally without rales, rhonchi, wheezing or stridor.  Cardio: RRR without murmurs, rubs or gallops. Brisk peripheral pulses without edema, delayed right TP pulse comparatively. Bilateral lower leg blood pressures are equal and comparable to upper arm BP.  Musculoskeletal: Full ROM all peripheral extremities,5/5 strength, and antalgic gait Skin: Right foot at 2nd-3rd toe with tender circular calus with 3 smaller warts surrounding it. Warm, dry without rashes,  ecchymosis. Psych: Awake and oriented X 3, normal affect, Insight and Judgment appropriate.      Assessment & Plan:  :  Essential hypertension -     losartan (COZAAR) 100 MG tablet; Take 1 tablet (100 mg total) by mouth daily. -     BASIC METABOLIC PANEL WITH GFR - check labs since recently on ACE/ARB, if any increase in kidney function will get renal Marland Kitchen - normal arm and leg BP's, no evidence of coarctation.  - HTN likely weight/diet related, long discussion with the patient.   Plantar wart Area was cleaned with alcohol, lidocaine 3 CC used to numb the area, the plantar wart was shaved down to the base and electric cautery was used, 3 smaller plantar warts surrounding were numbed and electric cautery was used,  patient tolerated well.  Area wrapped and patient given instructions on cleaning and return precautions.  If it returns  can schedule follow up or try at home treatments first.  Do not walk barefoot.   No future appointments.  Need to schedule

## 2019-05-12 ENCOUNTER — Telehealth: Payer: Self-pay

## 2019-05-12 LAB — BASIC METABOLIC PANEL WITH GFR
BUN: 13 mg/dL (ref 7–25)
CO2: 29 mmol/L (ref 20–32)
Calcium: 10.2 mg/dL (ref 8.6–10.3)
Chloride: 104 mmol/L (ref 98–110)
Creat: 0.77 mg/dL (ref 0.60–1.35)
GFR, Est African American: 151 mL/min/{1.73_m2} (ref >=60)
GFR, Est Non African American: 131 mL/min/{1.73_m2} (ref >=60)
Glucose, Bld: 102 mg/dL — ABNORMAL HIGH (ref 65–99)
Potassium: 4.5 mmol/L (ref 3.5–5.3)
Sodium: 141 mmol/L (ref 135–146)

## 2019-05-12 NOTE — Telephone Encounter (Signed)
Patient needs to know when he should come back for a follow up since he is increasing his Losartan?

## 2019-05-12 NOTE — Progress Notes (Signed)
05/12/2019--PATIENT'S MOTHER IS AWARE OF LAB RESULTS. Bea Laura [facility]

## 2019-05-13 NOTE — Telephone Encounter (Signed)
I sent Amy a message when I closed his chart to schedule a follow up 6-8 weeks for BP.  She should be calling.  Marchelle Folks

## 2019-05-25 ENCOUNTER — Ambulatory Visit (INDEPENDENT_AMBULATORY_CARE_PROVIDER_SITE_OTHER): Payer: BC Managed Care – PPO | Admitting: Physician Assistant

## 2019-05-25 ENCOUNTER — Encounter: Payer: Self-pay | Admitting: Physician Assistant

## 2019-05-25 ENCOUNTER — Other Ambulatory Visit: Payer: Self-pay

## 2019-05-25 VITALS — BP 128/74 | HR 76 | Temp 97.7°F | Wt 272.0 lb

## 2019-05-25 DIAGNOSIS — Z79899 Other long term (current) drug therapy: Secondary | ICD-10-CM | POA: Diagnosis not present

## 2019-05-25 DIAGNOSIS — B070 Plantar wart: Secondary | ICD-10-CM | POA: Diagnosis not present

## 2019-05-25 DIAGNOSIS — I10 Essential (primary) hypertension: Secondary | ICD-10-CM | POA: Diagnosis not present

## 2019-05-25 MED ORDER — DOXYCYCLINE HYCLATE 100 MG PO CAPS: ORAL_CAPSULE | ORAL | 0 refills | Status: DC

## 2019-05-25 NOTE — Progress Notes (Signed)
Subjective:    Patient ID: Keith Singh, male    DOB: 2001/02/19, 19 y.o.   MRN: [id number]  HPI 19 y.o. WM presents for follow up for BP and plantar wart removal.   States foot is doing better but he has 6 small warts on his right foot and 2 on his left foot. Also has a thing on his right elbow that he wants looked at. States he has been doing OTC acid treatments on his foot and it is yellow and scaling.   He was started on losartan 50 mg, did not increase the dose, BP has been about the same here at the office and controlled BP Readings from Last 3 Encounters:  05/25/19 128/74  05/11/19 (!) 146/88  04/29/19 (!) 120/100   He started on a vitamin D supplement.  States the iron gave him constipation, will try every other day or increase greens.  Lab Results  Component Value Date   IRON 47 (L) 04/29/2019   TIBC 355 04/29/2019   FERRITIN 136 04/29/2019     Blood pressure 128/74, pulse 76, temperature 97.7 F (36.5 C), weight 272 lb (123.4 kg), SpO2 97 %.  Medications   Current Outpatient Medications (Cardiovascular):  .  losartan (COZAAR) 100 MG tablet, Take 1 tablet (100 mg total) by mouth daily.     Current Outpatient Medications (Other):  Marland Kitchen  Ascorbic Acid (VITAMIN C) 500 MG CAPS, Take by mouth. .  Black Pepper-Turmeric (TURMERIC COMPLEX/BLACK PEPPER) 3-500 MG CAPS, Take by mouth. .  Garlic 244 MG TABS, Take by mouth. .  Magnesium 200 MG TABS, Take by mouth. .  Multiple Vitamin (MULTIVITAMIN) tablet, Take by mouth daily. .  Potassium 99 MG TABS, Take by mouth. .  Probiotic Product (PROBIOTIC PO), Take by mouth. .  SELENIUM PO, Take by mouth. Marland Kitchen  VITAMIN D PO, Take 5,000 Units by mouth. .  vitamin E 1000 UNIT capsule, Take 268 Units by mouth daily. .  Zinc 30 MG TABS, Take by mouth.  Problem list He has Atypical migraine; Hypertension; Medication management; and Vitamin D deficiency on their problem list.   Review of Systems See HPI  Objective:   Physical  Exam General Appearance: Well nourished, in no apparent distress.  ENT/Mouth:Mouth and nose not examined- patient wearing a facemask Respiratory: Respiratory effort normal, BS equal bilaterally without rales, rhonchi, wheezing or stridor.  Cardio: RRR without murmurs, rubs or gallops. Brisk peripheral pulses without edema.  Musculoskeletal: Full ROM all peripheral extremities,5/5 strength, and antalgic gait Skin: right supra cubital tender non fluctuate nodule 2cm. Right foot with yellow scaly skin, with black nodule/alus at 2nd-3rd toe, 3 smaller warts surrounding it, 2 small black dots, none tender right foot. Warm, dry without rashes,  ecchymosis. Psych: Awake and oriented X 3, normal affect, Insight and Judgment appropriate.          Assessment & Plan:  :  Essential hypertension -     Continue losartan 50 mg daily - normal kidney function last visit.  - normal arm and leg BP's, no evidence of coarctation.  - HTN likely weight/diet related, long discussion with the patient.   Plantar wart Will refer to podiatry for possible debridement/treatment, stop the home treatment acid- wash and pat dry foot, watch for infection Right foot some of the skin was debrided away, the areas that were cauterized last visit are non tender, will not treat again 3 freeze and thaw done today on 2 dots on left foot  Future  Appointments  Date Time Provider Department Center  06/15/2019  3:30 PM Quentin Mulling, PA-C [id number] None

## 2019-05-25 NOTE — Patient Instructions (Signed)
Skin Abscess  Do heating pad Will put on antibiotic Monitor Can schedule removal with Dr. Oneta Rack if it does not go away.   A skin abscess is an infected area on or under your skin that contains a collection of pus and other material. An abscess may also be called a furuncle, carbuncle, or boil. An abscess can occur in or on almost any part of your body. Some abscesses break open (rupture) on their own. Most continue to get worse unless they are treated. The infection can spread deeper into the body and eventually into your blood, which can make you feel ill. Treatment usually involves draining the abscess. What are the causes? An abscess occurs when germs, like bacteria, pass through your skin and cause an infection. This may be caused by:  A scrape or cut on your skin.  A puncture wound through your skin, including a needle injection or insect bite.  Blocked oil or sweat glands.  Blocked and infected hair follicles.  A cyst that forms beneath your skin (sebaceous cyst) and becomes infected. What increases the risk? This condition is more likely to develop in people who:  Have a weak body defense system (immune system).  Have diabetes.  Have dry and irritated skin.  Get frequent injections or use illegal IV drugs.  Have a foreign body in a wound, such as a splinter.  Have problems with their lymph system or veins. What are the signs or symptoms? Symptoms of this condition include:  A painful, firm bump under the skin.  A bump with pus at the top. This may break through the skin and drain. Other symptoms include:  Redness surrounding the abscess site.  Warmth.  Swelling of the lymph nodes (glands) near the abscess.  Tenderness.  A sore on the skin. How is this diagnosed? This condition may be diagnosed based on:  A physical exam.  Your medical history.  A sample of pus. This may be used to find out what is causing the infection.  Blood tests.  Imaging  tests, such as an ultrasound, CT scan, or MRI. How is this treated? A small abscess that drains on its own may not need treatment. Treatment for larger abscesses may include:  Moist heat or heat pack applied to the area several times a day.  A procedure to drain the abscess (incision and drainage).  Antibiotic medicines. For a severe abscess, you may first get antibiotics through an IV and then change to antibiotics by mouth. Follow these instructions at home: Medicines   Take over-the-counter and prescription medicines only as told by your health care provider.  If you were prescribed an antibiotic medicine, take it as told by your health care provider. Do not stop taking the antibiotic even if you start to feel better. Abscess care   If you have an abscess that has not drained, apply heat to the affected area. Use the heat source that your health care provider recommends, such as a moist heat pack or a heating pad. ? Place a towel between your skin and the heat source. ? Leave the heat on for 20-30 minutes. ? Remove the heat if your skin turns bright red. This is especially important if you are unable to feel pain, heat, or cold. You may have a greater risk of getting burned.  Follow instructions from your health care provider about how to take care of your abscess. Make sure you: ? Cover the abscess with a bandage (dressing). ? Change your  dressing or gauze as told by your health care provider. ? Wash your hands with soap and water before you change the dressing or gauze. If soap and water are not available, use hand sanitizer.  Check your abscess every day for signs of a worsening infection. Check for: ? More redness, swelling, or pain. ? More fluid or blood. ? Warmth. ? More pus or a bad smell. General instructions  To avoid spreading the infection: ? Do not share personal care items, towels, or hot tubs with others. ? Avoid making skin contact with other people.  Keep  all follow-up visits as told by your health care provider. This is important. Contact a health care provider if you have:  More redness, swelling, or pain around your abscess.  More fluid or blood coming from your abscess.  Warm skin around your abscess.  More pus or a bad smell coming from your abscess.  A fever.  Muscle aches.  Chills or a general ill feeling. Get help right away if you:  Have severe pain.  See red streaks on your skin spreading away from the abscess. Summary  A skin abscess is an infected area on or under your skin that contains a collection of pus and other material.  A small abscess that drains on its own may not need treatment.  Treatment for larger abscesses may include having a procedure to drain the abscess and taking an antibiotic. This information is not intended to replace advice given to you by your health care provider. Make sure you discuss any questions you have with your health care provider. Document Revised: 07/10/2018 Document Reviewed: 05/02/2017 Elsevier Patient Education  2020 [employer].

## 2019-05-27 DIAGNOSIS — Z1152 Encounter for screening for COVID-19: Secondary | ICD-10-CM | POA: Diagnosis not present

## 2019-06-01 ENCOUNTER — Ambulatory Visit (INDEPENDENT_AMBULATORY_CARE_PROVIDER_SITE_OTHER): Payer: BC Managed Care – PPO | Admitting: Podiatry

## 2019-06-01 ENCOUNTER — Other Ambulatory Visit: Payer: Self-pay

## 2019-06-01 DIAGNOSIS — M79671 Pain in right foot: Secondary | ICD-10-CM | POA: Diagnosis not present

## 2019-06-01 DIAGNOSIS — B078 Other viral warts: Secondary | ICD-10-CM

## 2019-06-01 DIAGNOSIS — M79672 Pain in left foot: Secondary | ICD-10-CM

## 2019-06-01 DIAGNOSIS — B079 Viral wart, unspecified: Secondary | ICD-10-CM

## 2019-06-01 NOTE — Progress Notes (Signed)
Subjective:   Patient ID: Keith Singh, male   DOB: 19 y.o.   MRN: [id number]   HPI 19 year old male presents the office today for concerns of on his right foot as well as left foot.  Still ongoing for last fall.  His primary care physician has tried freezing, scraping them.  The right side is worse than left.  Has an occasional discomfort.  Denies any redness or drainage or any other signs of infection.  He has no other concerns today.   Review of Systems  All other systems reviewed and are negative.  Past Medical History:  Diagnosis Date  . Atypical migraine   . Hypertension   . Seasonal allergies     Past Surgical History:  Procedure Laterality Date  . CIRCUMCISION       Current Outpatient Medications:  .  NONFORMULARY OR COMPOUNDED ITEM, [facility] #10 Wart Cream PRN refills Faxed in 06-01-19, Disp: , Rfl:  .  Ascorbic Acid (VITAMIN C) 500 MG CAPS, Take by mouth., Disp: , Rfl:  .  Black Pepper-Turmeric (TURMERIC COMPLEX/BLACK PEPPER) 3-500 MG CAPS, Take by mouth., Disp: , Rfl:  .  doxycycline (VIBRAMYCIN) 100 MG capsule, Take 1 capsule twice daily with food, Disp: 20 capsule, Rfl: 0 .  Garlic 416 MG TABS, Take by mouth., Disp: , Rfl:  .  losartan (COZAAR) 100 MG tablet, Take 1 tablet (100 mg total) by mouth daily., Disp: 90 tablet, Rfl: 1 .  Magnesium 200 MG TABS, Take by mouth., Disp: , Rfl:  .  Multiple Vitamin (MULTIVITAMIN) tablet, Take by mouth daily., Disp: , Rfl:  .  Potassium 99 MG TABS, Take by mouth., Disp: , Rfl:  .  Probiotic Product (PROBIOTIC PO), Take by mouth., Disp: , Rfl:  .  SELENIUM PO, Take by mouth., Disp: , Rfl:  .  VITAMIN D PO, Take 5,000 Units by mouth., Disp: , Rfl:  .  vitamin E 1000 UNIT capsule, Take 268 Units by mouth daily., Disp: , Rfl:  .  Zinc 30 MG TABS, Take by mouth., Disp: , Rfl:   Allergies  Allergen Reactions  . Zoster Vaccine Recombinant, Adjuvanted Swelling    Chicken pox vaccine  . Penicillins Rash          Objective:  Physical Exam  General: AAO x3, NAD  Dermatological: On plantar aspect of submetatarsal area of the right side worse than left there is large patches of wart, hyperkeratotic tissue.  There is no edema, erythema or any signs of infection noted today.  There were no open lesions identified.  Vascular: Dorsalis Pedis artery and Posterior Tibial artery pedal pulses are 2/4 bilateral with immedate capillary fill time.  There is no pain with calf compression, swelling, warmth, erythema.   Neruologic: Grossly intact via light touch bilateral.   Musculoskeletal: No gross boney pedal deformities bilateral. No pain, crepitus, or limitation noted with foot and ankle range of motion bilateral. Muscular strength 5/5 in all groups tested bilateral.  Gait: Unassisted, Nonantalgic.       Assessment:   Verruca bilaterally     Plan:  -Treatment options discussed including all alternatives, risks, and complications -Etiology of symptoms were discussed -Lesions were debrided without any complications or bleeding.  Today along the area the larger patches of warts that lesions were lasered today following standard precautions and tolerated well without any side effects. -I ordered a compound cream today for [facility] for the verruca as well  Return in about 3 weeks (around 06/22/2019)  for wart- laser.  Vivi Barrack DPM

## 2019-06-01 NOTE — Patient Instructions (Signed)
I have ordered a medication for you that will come from [facility] in [place]. They should be calling you to verify insurance and will mail the medication to you. If you live close by then you can go by their pharmacy to pick up the medication. Their phone number is [phone]. If you do not hear from them in the next few days, please give us a call at [phone].   

## 2019-06-02 ENCOUNTER — Telehealth: Payer: Self-pay | Admitting: *Deleted

## 2019-06-02 NOTE — Telephone Encounter (Signed)
Pt's mtr, Tally Due states pt was seen yesterday with Dr. Ardelle Anton and scheduled on the [facility] schedule, but is to have a laser wart procedure.

## 2019-06-02 NOTE — Telephone Encounter (Signed)
Left message informing pt's mtr, that pt was scheduled for laser wart procedure and Casting was just one of the grouping for that schedule slot.

## 2019-06-03 DIAGNOSIS — H1045 Other chronic allergic conjunctivitis: Secondary | ICD-10-CM | POA: Diagnosis not present

## 2019-06-15 ENCOUNTER — Ambulatory Visit: Payer: BC Managed Care – PPO | Admitting: Physician Assistant

## 2019-06-22 ENCOUNTER — Other Ambulatory Visit: Payer: Self-pay

## 2019-06-22 ENCOUNTER — Ambulatory Visit: Payer: Self-pay | Admitting: Podiatry

## 2019-06-22 DIAGNOSIS — B079 Viral wart, unspecified: Secondary | ICD-10-CM

## 2019-06-22 DIAGNOSIS — B078 Other viral warts: Secondary | ICD-10-CM | POA: Diagnosis not present

## 2019-06-22 DIAGNOSIS — B351 Tinea unguium: Secondary | ICD-10-CM

## 2019-06-25 DIAGNOSIS — D649 Anemia, unspecified: Secondary | ICD-10-CM | POA: Insufficient documentation

## 2019-06-25 NOTE — Progress Notes (Deleted)
  Assessment and Plan:    HPI 19 y.o.male presents for follow up for HTN and anemia.   His blood pressure {HAS HAS NOT:18834} been controlled at home, today their BP is    He {DOES_DOES WPY:09983} workout. He denies chest pain, shortness of breath, dizziness. BMI is There is no height or weight on file to calculate BMI., he is working on diet and exercise. Wt Readings from Last 3 Encounters:  05/25/19 272 lb (123.4 kg)  05/11/19 268 lb 9.6 oz (121.8 kg)  04/29/19 271 lb (122.9 kg)    The cholesterol last visit was:   Lab Results  Component Value Date   CHOL 133 04/29/2019   HDL 46 04/29/2019   LDLCALC 68 04/29/2019   TRIG 108 04/29/2019   CHOLHDL 2.9 04/29/2019    Last A1C in the office was:  Lab Results  Component Value Date   HGBA1C 5.1 04/29/2019   Patient is on Vitamin D supplement, he started on vitamin D.   Lab Results  Component Value Date   VD25OH 34 04/29/2019       Patient Active Problem List   Diagnosis Date Noted  . Anemia 06/25/2019  . Medication management 04/30/2019  . Vitamin D deficiency 04/30/2019  . Hypertension   . Atypical migraine 08/10/2015      Current Outpatient Medications (Cardiovascular):  .  losartan (COZAAR) 100 MG tablet, Take 1 tablet (100 mg total) by mouth daily.     Current Outpatient Medications (Other):  Marland Kitchen  Ascorbic Acid (VITAMIN C) 500 MG CAPS, Take by mouth. .  Black Pepper-Turmeric (TURMERIC COMPLEX/BLACK PEPPER) 3-500 MG CAPS, Take by mouth. .  doxycycline (VIBRAMYCIN) 100 MG capsule, Take 1 capsule twice daily with food .  Garlic 300 MG TABS, Take by mouth. .  Magnesium 200 MG TABS, Take by mouth. .  Multiple Vitamin (MULTIVITAMIN) tablet, Take by mouth daily. .  NONFORMULARY OR COMPOUNDED ITEM, [facility] #[facility] Wart Cream PRN refills Faxed in 06-01-19 .  Potassium 99 MG TABS, Take by mouth. .  Probiotic Product (PROBIOTIC PO), Take by mouth. .  SELENIUM PO, Take by mouth. Marland Kitchen  VITAMIN D PO, Take 5,000 Units  by mouth. .  vitamin E 1000 UNIT capsule, Take 268 Units by mouth daily. .  Zinc 30 MG TABS, Take by mouth.  Allergies  Allergen Reactions  . Zoster Vaccine Recombinant, Adjuvanted Swelling    Chicken pox vaccine  . Penicillins Rash    ROS: all negative except above.   Physical Exam: There were no vitals filed for this visit. There were no vitals taken for this visit. General Appearance: Well nourished, in no apparent distress. Eyes: PERRLA, EOMs, conjunctiva no swelling or erythema Sinuses: No Frontal/maxillary tenderness ENT/Mouth: Ext aud canals clear, TMs without erythema, bulging. No erythema, swelling, or exudate on post pharynx.  Tonsils not swollen or erythematous. Hearing normal.  Neck: Supple, thyroid normal.  Respiratory: Respiratory effort normal, BS equal bilaterally without rales, rhonchi, wheezing or stridor.  Cardio: RRR with no MRGs. Brisk peripheral pulses without edema.  Abdomen: Soft, + BS.  Non tender, no guarding, rebound, hernias, masses. Lymphatics: Non tender without lymphadenopathy.  Musculoskeletal: Full ROM, 5/5 strength, normal gait.  Skin: Warm, dry without rashes, lesions, ecchymosis.  Neuro: Cranial nerves intact. Normal muscle tone, no cerebellar symptoms. Sensation intact.  Psych: Awake and oriented X 3, normal affect, Insight and Judgment appropriate.     Keith Mulling, PA-C 1:25 PM [facility]

## 2019-06-28 DIAGNOSIS — B078 Other viral warts: Secondary | ICD-10-CM | POA: Insufficient documentation

## 2019-06-28 DIAGNOSIS — B079 Viral wart, unspecified: Secondary | ICD-10-CM | POA: Insufficient documentation

## 2019-06-28 NOTE — Progress Notes (Signed)
Subjective: 19 year old male presents the office today for evaluation of warts to his right foot worse than left.  He states that he has been using a compound cream that we ordered through [facility] and he underwent laser last appointment.  He states that he has not seen any more warts on the left side.  The right foot warts to cause discomfort.  No side effects after the laser treatment or with the cream. Denies any systemic complaints such as fevers, chills, nausea, vomiting. No acute changes since last appointment, and no other complaints at this time.   Objective: AAO x3, NAD DP/PT pulses palpable bilaterally, CRT less than 3 seconds Large patch of warts are present just distal to the metatarsal heads plantarly.  Also smaller lesions on the digits.  There is no edema, erythema.  Appears to be somewhat more superficial.  No open lesions or pre-ulcerative lesions.  No pain with calf compression, swelling, warmth, erythema  Assessment: Verruca  Plan: -All treatment options discussed with the patient including all alternatives, risks, complications.  -Debrided the skin lesions without any complications or bleeding.  Again the warts were lasered following standard safety precautions.  Continue with a compound cream through [facility].  We discussed switching to Efudex if not any significant improvement with the current regimen -Patient encouraged to call the office with any questions, concerns, change in symptoms.   Vivi Barrack DPM

## 2019-06-29 ENCOUNTER — Ambulatory Visit: Payer: BC Managed Care – PPO | Admitting: Physician Assistant

## 2019-07-06 ENCOUNTER — Other Ambulatory Visit: Payer: Self-pay

## 2019-07-06 ENCOUNTER — Ambulatory Visit (INDEPENDENT_AMBULATORY_CARE_PROVIDER_SITE_OTHER): Payer: BC Managed Care – PPO | Admitting: *Deleted

## 2019-07-06 DIAGNOSIS — B351 Tinea unguium: Secondary | ICD-10-CM

## 2019-07-06 DIAGNOSIS — B079 Viral wart, unspecified: Secondary | ICD-10-CM

## 2019-07-06 DIAGNOSIS — B078 Other viral warts: Secondary | ICD-10-CM

## 2019-07-06 NOTE — Progress Notes (Signed)
Patient presents today for laser treatment # 3 for multiple plantar warts on the right foot. He states the areas have been less painful over the last week.  Dr. Ardelle Anton patient.  All other systems are negative.  Dr. Ardelle Anton debrided the lesion. Laser therapy was administered to the right foot to multiple lesions. The patient tolerated the treatment well. All safety precautions were in place.   Patient has a small open wound to the 3rd interspace right. She was advised to stop using the topical medication until later in the week to allow the area to heal.  Follow up in 3 weeks for laser # 4.

## 2019-07-20 ENCOUNTER — Other Ambulatory Visit: Payer: BC Managed Care – PPO

## 2019-09-22 DIAGNOSIS — Z6835 Body mass index (BMI) 35.0-35.9, adult: Secondary | ICD-10-CM | POA: Diagnosis not present

## 2019-09-22 DIAGNOSIS — I10 Essential (primary) hypertension: Secondary | ICD-10-CM | POA: Diagnosis not present

## 2019-09-22 DIAGNOSIS — L259 Unspecified contact dermatitis, unspecified cause: Secondary | ICD-10-CM | POA: Diagnosis not present

## 2020-02-23 DIAGNOSIS — R0982 Postnasal drip: Secondary | ICD-10-CM | POA: Diagnosis not present

## 2020-02-23 DIAGNOSIS — J029 Acute pharyngitis, unspecified: Secondary | ICD-10-CM | POA: Diagnosis not present

## 2020-02-23 DIAGNOSIS — J329 Chronic sinusitis, unspecified: Secondary | ICD-10-CM | POA: Diagnosis not present

## 2020-04-13 DIAGNOSIS — H1045 Other chronic allergic conjunctivitis: Secondary | ICD-10-CM | POA: Diagnosis not present

## 2020-08-12 ENCOUNTER — Other Ambulatory Visit: Payer: Self-pay

## 2020-08-12 ENCOUNTER — Ambulatory Visit (INDEPENDENT_AMBULATORY_CARE_PROVIDER_SITE_OTHER): Payer: BC Managed Care – PPO | Admitting: Adult Health

## 2020-08-12 ENCOUNTER — Encounter: Payer: Self-pay | Admitting: Adult Health

## 2020-08-12 VITALS — BP 140/102 | HR 78 | Temp 97.7°F | Ht 71.75 in | Wt 289.8 lb

## 2020-08-12 DIAGNOSIS — Z130 Encounter for screening for diseases of the blood and blood-forming organs and certain disorders involving the immune mechanism: Secondary | ICD-10-CM

## 2020-08-12 DIAGNOSIS — E559 Vitamin D deficiency, unspecified: Secondary | ICD-10-CM | POA: Diagnosis not present

## 2020-08-12 DIAGNOSIS — Z0000 Encounter for general adult medical examination without abnormal findings: Secondary | ICD-10-CM

## 2020-08-12 DIAGNOSIS — Z131 Encounter for screening for diabetes mellitus: Secondary | ICD-10-CM

## 2020-08-12 DIAGNOSIS — I10 Essential (primary) hypertension: Secondary | ICD-10-CM

## 2020-08-12 DIAGNOSIS — Z1389 Encounter for screening for other disorder: Secondary | ICD-10-CM

## 2020-08-12 DIAGNOSIS — E669 Obesity, unspecified: Secondary | ICD-10-CM | POA: Insufficient documentation

## 2020-08-12 DIAGNOSIS — Z13220 Encounter for screening for lipoid disorders: Secondary | ICD-10-CM | POA: Diagnosis not present

## 2020-08-12 DIAGNOSIS — Z1329 Encounter for screening for other suspected endocrine disorder: Secondary | ICD-10-CM | POA: Diagnosis not present

## 2020-08-12 DIAGNOSIS — Z79899 Other long term (current) drug therapy: Secondary | ICD-10-CM | POA: Diagnosis not present

## 2020-08-12 DIAGNOSIS — D509 Iron deficiency anemia, unspecified: Secondary | ICD-10-CM

## 2020-08-12 DIAGNOSIS — R635 Abnormal weight gain: Secondary | ICD-10-CM

## 2020-08-12 DIAGNOSIS — G43009 Migraine without aura, not intractable, without status migrainosus: Secondary | ICD-10-CM

## 2020-08-12 MED ORDER — LOSARTAN POTASSIUM 50 MG PO TABS
50.0000 mg | ORAL_TABLET | Freq: Every day | ORAL | 3 refills | Status: DC
Start: 2020-08-12 — End: 2021-08-18

## 2020-08-12 NOTE — Progress Notes (Signed)
Complete Physical  Assessment and Plan:   Encounter for general adult medical examination with abnormal findings 1 year  Atypical migraine Rare since 2017, had very good work up, controlled by avoiding caffeine Pt states his HR is normal 50-60's will avoid BB for now  Essential hypertension Do dash diet, restart losartan 50 mg with close follow up Monitor BP at home -     CBC with Differential/Platelet -     COMPLETE METABOLIC PANEL WITH GFR -     TSH -     Urinalysis, Routine w reflex microscopic -     Microalbumin / creatinine urine ratio -     losartan (COZAAR) 50 MG tablet; Take 1 tablet (50 mg total) by mouth daily.  Screening for hematuria or proteinuria Check for proteinuria with BP -     Urinalysis, Routine w reflex microscopic -     Microalbumin / creatinine urine ratio  Medication management -     CBC with Differential/Platelet -     COMPLETE METABOLIC PANEL WITH GFR -     TSH -     Magnesium  Vitamin D deficiency -     VITAMIN D 25 Hydroxy (Vit-D Deficiency, Fractures)  Anemia, deficiency, iron -     Iron/TIBC/Ferritin -     CBC  Discussed med's effects and SE's. Screening labs and tests as requested with regular follow-up as recommended. Over 40 minutes of exam, counseling, chart review and critical decision making was performed  Future Appointments  Date Time Provider [place]  11/25/2020  9:30 AM Liane Comber, NP [facility] None  08/14/2021 10:00 AM Liane Comber, NP [facility] None     HPI 20 y.o. male patient presents for CPE. He has Atypical migraine; Hypertension; Medication management; Vitamin D deficiency; Anemia; Verruca; and Obesity (BMI 30.0-34.9) on their problem list.  He is dating, 2 years, declines STD testing. She is on birth control.   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   R foot warts, was referred to podiatry, reports laser treatment and has resolved.   He has a  history of atypical migraine in the past and has seen Dr. Gaynell Face, ped neuro in 2017.  In 2017, he had left sided hemiplegia and hemiparesis with a headache, had negative head CT, CT angio neck/head, and MRI/MRA. Negative coagulation study and thrombophilia work up at that time, determined to be a complex migraine. Normal echo. He reports improved with reduced caffeine, typically 1-2 per year. Will take acetaminophen.   BMI is Body mass index is 39.58 kg/m., he has been working on diet and exercise. Doesn't eat out too much, tracks food, fruit, dairy, granola/lara bars during the day, eats at home for dinner - standard [employer], around 2000 cal, measures/weighs Active at job, also was going to gym regularly until 1 month ago 3 sodas/week, drinks water otherwise Wt Readings from Last 3 Encounters:  08/12/20 289 lb 12.8 oz (131.5 kg)  05/25/19 272 lb (123.4 kg)  05/11/19 268 lb 9.6 oz (121.8 kg)   His blood pressure has not been controlled at home, was 120/70s on losartan 50 mg but ran out, today their BP is BP: (!) 140/102  He does workout. He denies chest pain, shortness of breath, dizziness.  He is not on cholesterol medication and denies myalgias. The cholesterol last visit was:   Lab Results  Component Value Date   CHOL 133 04/29/2019   HDL 46 04/29/2019  LDLCALC 68 04/29/2019   TRIG 108 04/29/2019   CHOLHDL 2.9 04/29/2019    He has been working on diet and exercise for glucose management, and denies increased appetite, nausea, paresthesia of the feet, polydipsia, polyuria and visual disturbances. Last A1C in the office was:  Lab Results  Component Value Date   HGBA1C 5.1 04/29/2019   Patient is on Vitamin D supplement, he is taking 5000 IU   Lab Results  Component Value Date   VD25OH 34 04/29/2019     Lab Results  Component Value Date   WBC 10.9 (H) 04/29/2019   HGB 16.2 04/29/2019   HCT 46.6 04/29/2019   MCV 87.4 04/29/2019   PLT 309 04/29/2019   Reports  has been taking daily ? 45 or 65 Mg slow release iron daily Lab Results  Component Value Date   IRON 47 (L) 04/29/2019   TIBC 355 04/29/2019   FERRITIN 136 04/29/2019    Current Medications:  Current Outpatient Medications on File Prior to Visit  Medication Sig Dispense Refill  . Ascorbic Acid (VITAMIN C) 500 MG CAPS Take by mouth.    Marland Kitchen Black Pepper-Turmeric 3-500 MG CAPS Take by mouth.    . Garlic 294 MG TABS Take by mouth.    . Iron, Ferrous Sulfate, 325 (65 Fe) MG TABS Take by mouth daily.    . Magnesium 200 MG TABS Take by mouth.    . Multiple Vitamin (MULTIVITAMIN) tablet Take by mouth daily.    . Probiotic Product (PROBIOTIC PO) Take by mouth.    . QUERCETIN PO Take 250 mg by mouth daily.    Marland Kitchen VITAMIN D PO Take 5,000 Units by mouth.    . vitamin E 1000 UNIT capsule Take 268 Units by mouth daily.    . Zinc 30 MG TABS Take 50 mg by mouth.     No current facility-administered medications on file prior to visit.   Allergies:  Allergies  Allergen Reactions  . Zoster Vaccine Recombinant, Adjuvanted Swelling    Chicken pox vaccine  . Penicillins Rash   Health Maintenance:  Immunization History  Administered Date(s) Administered  . DTaP 05/02/1999, 07/03/1999, 03/01/2000, 05/16/2000, 09/05/2004  . HPV Quadrivalent 12/10/2012  . Hepatitis A 11/28/2007, 02/03/2009  . Hepatitis B 03/17/1999, 06/02/1999, 12/29/1999  . HiB (PRP-OMP) 03/17/1999, 06/02/1999, 12/29/1999  . IPV 03/02/1999, 05/02/1999, 07/03/1999, 09/05/2004  . MMR 12/29/1999, 09/05/2004  . Pneumococcal Conjugate-13 05/16/2000  . Pneumococcal-Unspecified 03/17/1999, 06/02/1999  . Tdap 02/03/2009, 04/29/2019  . Varicella 12/29/1999, 11/28/2007    Tetanus: 2021 Pneumovax: ** Flu vaccine: declines  Covid 19: declines  HPV: 2014  Colonoscopy: n/a EGD: n/a  Eye Exam: [facility], last 05/2020 Dentist: Felipa Eth ?, last 07/2020, goes q74m Patient Care Team: MUnk Pinto MD as PCP - General (Internal  Medicine)  Medical History:  has Atypical migraine; Hypertension; Medication management; Vitamin D deficiency; Anemia; Verruca; and Obesity (BMI 30.0-34.9) on their problem list. Surgical History:  He  has a past surgical history that includes Circumcision. Family History:  His family history includes CVA in his paternal grandmother; Cancer in his paternal grandfather and paternal grandmother; Cancer (age of onset: 413 in his paternal uncle; Dementia in his paternal grandmother; Gout in his father; Heart disease in his paternal grandmother; Hyperlipidemia in his father; Hypertension in his mother; Kidney disease in his paternal grandfather; Lung cancer in his maternal grandfather; Migraines in his mother; Stroke in his paternal grandfather. Social History:   reports that he has never smoked.  He has never used smokeless tobacco. He reports current alcohol use of about 3.0 standard drinks of alcohol per week. He reports that he does not use drugs.  Review of Systems:  Review of Systems  Constitutional: Negative for malaise/fatigue and weight loss.  HENT: Negative for hearing loss and tinnitus.   Eyes: Negative for blurred vision and double vision.  Respiratory: Negative for cough, shortness of breath and wheezing.   Cardiovascular: Negative for chest pain, palpitations, orthopnea, claudication and leg swelling.  Gastrointestinal: Negative for abdominal pain, blood in stool, constipation, diarrhea, heartburn, melena, nausea and vomiting.  Genitourinary: Negative.   Musculoskeletal: Negative for joint pain and myalgias.  Skin: Negative for rash.  Neurological: Negative for dizziness, tingling, sensory change, weakness and headaches.  Endo/Heme/Allergies: Negative for polydipsia.  Psychiatric/Behavioral: Negative.   All other systems reviewed and are negative.   Physical Exam: Estimated body mass index is 39.58 kg/m as calculated from the following:   Height as of this encounter: 5' 11.75"  (1.822 m).   Weight as of this encounter: 289 lb 12.8 oz (131.5 kg). BP (!) 140/102   Pulse 78   Temp 97.7 F (36.5 C)   Ht 5' 11.75" (1.822 m)   Wt 289 lb 12.8 oz (131.5 kg)   SpO2 96%   BMI 39.58 kg/m  General Appearance: Well nourished, in no apparent distress.  Eyes: PERRLA, EOMs, conjunctiva no swelling or erythema Sinuses: No Frontal/maxillary tenderness  ENT/Mouth: Ext aud canals clear, normal light reflex with TMs without erythema, bulging. Good dentition. No erythema, swelling, or exudate on post pharynx. Tonsils not swollen or erythematous. Hearing normal.  Neck: Supple, thyroid normal. No bruits  Respiratory: Respiratory effort normal, BS equal bilaterally without rales, rhonchi, wheezing or stridor.  Cardio: RRR without murmurs, rubs or gallops. Brisk peripheral pulses without edema Chest: symmetric, with normal excursions and percussion.  Abdomen: Soft, obese, nontender, no guarding, rebound, hernias, masses, or organomegaly.  Lymphatics: Non tender without lymphadenopathy.  Genitourinary:  Musculoskeletal: Full ROM all peripheral extremities,5/5 strength, and normal gait.  Skin: Warm, dry without rashes,  Ecchymosis. Several mall benign appearing nevi to back, mild acne to back.  Well healed horizontal scar to left wrist.  Neuro: Cranial nerves intact, reflexes equal bilaterally. Normal muscle tone, no cerebellar symptoms. Sensation intact.  Psych: Awake and oriented X 3, normal affect, Insight and Judgment appropriate.   EKG: WNL 2021  Izora Ribas 12:38 PM [facility]

## 2020-08-12 NOTE — Patient Instructions (Addendum)
Keith Singh , Thank you for taking time to come for your Annual Wellness Visit. I appreciate your ongoing commitment to your health goals. Please review the following plan we discussed and let me know if I can assist you in the future.   These are the goals we discussed: Goals    . Blood Pressure < 120/80    . DIET - DECREASE SODA OR JUICE INTAKE    . Weight (lb) < 250 lb (113.4 kg)       This is a list of the screening recommended for you and due dates:  Health Maintenance  Topic Date Due  . COVID-19 Vaccine (1) 08/28/2020*  . HIV Screening  08/12/2021*  . Flu Shot  10/31/2020  . Tetanus Vaccine  04/28/2029  . HPV Vaccine  Discontinued  . Hepatitis C Screening: USPSTF Recommendation to screen - Ages 55-79 yo.  Discontinued  *Topic was postponed. The date shown is not the original due date.       Know what a healthy weight is for you (roughly BMI <25) and aim to maintain this  Aim for 7+ servings of fruits and vegetables daily  65-80+ fluid ounces of water or unsweet tea for healthy kidneys  Limit to max 1 drink of alcohol per day; avoid smoking/tobacco  Limit animal fats in diet for cholesterol and heart health - choose grass fed whenever available  Avoid highly processed foods, and foods high in saturated/trans fats  Aim for low stress - take time to unwind and care for your mental health  Aim for 150 min of moderate intensity exercise weekly for heart health, and weights twice weekly for bone health  Aim for 7-9 hours of sleep daily        High-Fiber Eating Plan Fiber, also called dietary fiber, is a type of carbohydrate. It is found foods such as fruits, vegetables, whole grains, and beans. A high-fiber diet can have many health benefits. Your health care provider may recommend a high-fiber diet to help:  Prevent constipation. Fiber can make your bowel movements more regular.  Lower your cholesterol.  Relieve the following conditions: ? Inflammation of  veins in the anus (hemorrhoids). ? Inflammation of specific areas of the digestive tract (uncomplicated diverticulosis). ? A problem of the large intestine, also called the colon, that sometimes causes pain and diarrhea (irritable bowel syndrome, or IBS).  Prevent overeating as part of a weight-loss plan.  Prevent heart disease, type 2 diabetes, and certain cancers. What are tips for following this plan? Reading food labels  Check the nutrition facts label on food products for the amount of dietary fiber. Choose foods that have 5 grams of fiber or more per serving.  The goals for recommended daily fiber intake include: ? Men (age 47 or younger): 34-38 g. ? Men (over age 45): 28-34 g. ? Women (age 59 or younger): 25-28 g. ? Women (over age 39): 22-25 g. Your daily fiber goal is _____________ g.   Shopping  Choose whole fruits and vegetables instead of processed forms, such as apple juice or applesauce.  Choose a wide variety of high-fiber foods such as avocados, lentils, oats, and kidney beans.  Read the nutrition facts label of the foods you choose. Be aware of foods with added fiber. These foods often have high sugar and sodium amounts per serving. Cooking  Use whole-grain flour for baking and cooking.  Cook with brown rice instead of white rice. Meal planning  Start the day with a breakfast  that is high in fiber, such as a cereal that contains 5 g of fiber or more per serving.  Eat breads and cereals that are made with whole-grain flour instead of refined flour or white flour.  Eat brown rice, bulgur wheat, or millet instead of white rice.  Use beans in place of meat in soups, salads, and pasta dishes.  Be sure that half of the grains you eat each day are whole grains. General information  You can get the recommended daily intake of dietary fiber by: ? Eating a variety of fruits, vegetables, grains, nuts, and beans. ? Taking a fiber supplement if you are not able to  take in enough fiber in your diet. It is better to get fiber through food than from a supplement.  Gradually increase how much fiber you consume. If you increase your intake of dietary fiber too quickly, you may have bloating, cramping, or gas.  Drink plenty of water to help you digest fiber.  Choose high-fiber snacks, such as berries, raw vegetables, nuts, and popcorn. What foods should I eat? Fruits Berries. Pears. Apples. Oranges. Avocado. Prunes and raisins. Dried figs. Vegetables Sweet potatoes. Spinach. Kale. Artichokes. Cabbage. Broccoli. Cauliflower. Green peas. Carrots. Squash. Grains Whole-grain breads. Multigrain cereal. Oats and oatmeal. Brown rice. Barley. Bulgur wheat. Millet. Quinoa. Bran muffins. Popcorn. Rye wafer crackers. Meats and other proteins Navy beans, kidney beans, and pinto beans. Soybeans. Split peas. Lentils. Nuts and seeds. Dairy Fiber-fortified yogurt. Beverages Fiber-fortified soy milk. Fiber-fortified orange juice. Other foods Fiber bars. The items listed above may not be a complete list of recommended foods and beverages. Contact a dietitian for more information. What foods should I avoid? Fruits Fruit juice. Cooked, strained fruit. Vegetables Fried potatoes. Canned vegetables. Well-cooked vegetables. Grains White bread. Pasta made with refined flour. White rice. Meats and other proteins Fatty cuts of meat. Fried chicken or fried fish. Dairy Milk. Yogurt. Cream cheese. Sour cream. Fats and oils Butters. Beverages Soft drinks. Other foods Cakes and pastries. The items listed above may not be a complete list of foods and beverages to avoid. Talk with your dietitian about what choices are best for you. Summary  Fiber is a type of carbohydrate. It is found in foods such as fruits, vegetables, whole grains, and beans.  A high-fiber diet has many benefits. It can help to prevent constipation, lower blood cholesterol, aid weight loss, and reduce  your risk of heart disease, diabetes, and certain cancers.  Increase your intake of fiber gradually. Increasing fiber too quickly may cause cramping, bloating, and gas. Drink plenty of water while you increase the amount of fiber you consume.  The best sources of fiber include whole fruits and vegetables, whole grains, nuts, seeds, and beans. This information is not intended to replace advice given to you by your health care provider. Make sure you discuss any questions you have with your health care provider. Document Revised: 07/23/2019 Document Reviewed: 07/23/2019 Elsevier Patient Education  2021 [employer].

## 2020-08-13 ENCOUNTER — Other Ambulatory Visit: Payer: Self-pay | Admitting: Adult Health

## 2020-08-13 MED ORDER — IRON (FERROUS SULFATE) 325 (65 FE) MG PO TABS: ORAL_TABLET | ORAL | Status: DC

## 2020-08-15 ENCOUNTER — Encounter: Payer: Self-pay | Admitting: Adult Health

## 2020-08-15 LAB — COMPLETE METABOLIC PANEL WITH GFR
AG Ratio: 1.6 (calc) (ref 1.0–2.5)
ALT: 43 U/L (ref 9–46)
AST: 25 U/L (ref 10–40)
Albumin: 4.4 g/dL (ref 3.6–5.1)
Alkaline phosphatase (APISO): 66 U/L (ref 36–130)
BUN: 14 mg/dL (ref 7–25)
CO2: 27 mmol/L (ref 20–32)
Calcium: 9.9 mg/dL (ref 8.6–10.3)
Chloride: 103 mmol/L (ref 98–110)
Creat: 0.86 mg/dL (ref 0.60–1.35)
GFR, Est African American: 144 mL/min/{1.73_m2} (ref >=60)
GFR, Est Non African American: 124 mL/min/{1.73_m2} (ref >=60)
Globulin: 2.8 g/dL (calc) (ref 1.9–3.7)
Glucose, Bld: 79 mg/dL (ref 65–99)
Potassium: 4.5 mmol/L (ref 3.5–5.3)
Sodium: 139 mmol/L (ref 135–146)
Total Bilirubin: 0.6 mg/dL (ref 0.2–1.2)
Total Protein: 7.2 g/dL (ref 6.1–8.1)

## 2020-08-15 LAB — URINALYSIS, ROUTINE W REFLEX MICROSCOPIC
Bilirubin Urine: NEGATIVE
Glucose, UA: NEGATIVE
Hgb urine dipstick: NEGATIVE
Ketones, ur: NEGATIVE
Leukocytes,Ua: NEGATIVE
Nitrite: NEGATIVE
Protein, ur: NEGATIVE
Specific Gravity, Urine: 1.004 (ref 1.001–1.035)
pH: 7.5 (ref 5.0–8.0)

## 2020-08-15 LAB — MICROALBUMIN / CREATININE URINE RATIO
Creatinine, Urine: 28 mg/dL (ref 20–320)
Microalb Creat Ratio: 7 mcg/mg creat (ref <30)
Microalb, Ur: 0.2 mg/dL

## 2020-08-15 LAB — CBC WITH DIFFERENTIAL/PLATELET
Absolute Monocytes: 785 cells/uL (ref 200–950)
Basophils Absolute: 62 cells/uL (ref 0–200)
Basophils Relative: 0.8 %
Eosinophils Absolute: 154 cells/uL (ref 15–500)
Eosinophils Relative: 2 %
HCT: 47.2 % (ref 38.5–50.0)
Hemoglobin: 16 g/dL (ref 13.2–17.1)
Lymphs Abs: 1987 cells/uL (ref 850–3900)
MCH: 29.3 pg (ref 27.0–33.0)
MCHC: 33.9 g/dL (ref 32.0–36.0)
MCV: 86.4 fL (ref 80.0–100.0)
MPV: 10.9 fL (ref 7.5–12.5)
Monocytes Relative: 10.2 %
Neutro Abs: 4712 cells/uL (ref 1500–7800)
Neutrophils Relative %: 61.2 %
Platelets: 266 10*3/uL (ref 140–400)
RBC: 5.46 10*6/uL (ref 4.20–5.80)
RDW: 12.4 % (ref 11.0–15.0)
Total Lymphocyte: 25.8 %
WBC: 7.7 10*3/uL (ref 3.8–10.8)

## 2020-08-15 LAB — IRON,TIBC AND FERRITIN PANEL
%SAT: 42 % (calc) (ref 20–48)
Ferritin: 182 ng/mL (ref 38–380)
Iron: 138 ug/dL (ref 50–195)
TIBC: 331 mcg/dL (calc) (ref 250–425)

## 2020-08-15 LAB — INSULIN, RANDOM: Insulin: 19.4 u[IU]/mL

## 2020-08-15 LAB — VITAMIN D 25 HYDROXY (VIT D DEFICIENCY, FRACTURES): Vit D, 25-Hydroxy: 65 ng/mL (ref 30–100)

## 2020-08-15 LAB — MAGNESIUM: Magnesium: 2.2 mg/dL (ref 1.5–2.5)

## 2020-08-15 LAB — TSH: TSH: 2.21 mIU/L (ref 0.40–4.50)

## 2020-11-25 ENCOUNTER — Other Ambulatory Visit: Payer: Self-pay

## 2020-11-25 ENCOUNTER — Ambulatory Visit (INDEPENDENT_AMBULATORY_CARE_PROVIDER_SITE_OTHER): Payer: BC Managed Care – PPO | Admitting: Adult Health

## 2020-11-25 ENCOUNTER — Encounter: Payer: Self-pay | Admitting: Adult Health

## 2020-11-25 VITALS — BP 112/90 | HR 70 | Temp 97.3°F | Wt 276.0 lb

## 2020-11-25 DIAGNOSIS — Z79899 Other long term (current) drug therapy: Secondary | ICD-10-CM | POA: Diagnosis not present

## 2020-11-25 DIAGNOSIS — E669 Obesity, unspecified: Secondary | ICD-10-CM | POA: Diagnosis not present

## 2020-11-25 DIAGNOSIS — H52223 Regular astigmatism, bilateral: Secondary | ICD-10-CM | POA: Diagnosis not present

## 2020-11-25 DIAGNOSIS — I10 Essential (primary) hypertension: Secondary | ICD-10-CM

## 2020-11-25 DIAGNOSIS — H53143 Visual discomfort, bilateral: Secondary | ICD-10-CM | POA: Diagnosis not present

## 2020-11-25 LAB — BASIC METABOLIC PANEL WITH GFR
BUN: 11 mg/dL (ref 7–25)
CO2: 29 mmol/L (ref 20–32)
Calcium: 9.6 mg/dL (ref 8.6–10.3)
Chloride: 103 mmol/L (ref 98–110)
Creat: 0.68 mg/dL (ref 0.60–1.24)
Glucose, Bld: 86 mg/dL (ref 65–99)
Potassium: 4.9 mmol/L (ref 3.5–5.3)
Sodium: 138 mmol/L (ref 135–146)
eGFR: 136 mL/min/{1.73_m2} (ref >=60)

## 2020-11-25 NOTE — Progress Notes (Signed)
Assessment and Plan:  Amauri was seen today for follow-up.  Diagnoses and all orders for this visit:  Primary hypertension Continue medication Monitor blood pressure at home; call if consistently over 130/80 Continue DASH diet; reviewed in detail.  Reminder to go to the ER if any CP, SOB, nausea, dizziness, severe HA, changes vision/speech, left arm numbness and tingling and jaw pain. -     BASIC METABOLIC PANEL WITH GFR  Obesity (BMI 30.0-34.9) Long discussion about weight loss, diet, and exercise Recommended diet heavy in fruits and veggies and low in animal meats, cheeses, and dairy products, processed food, appropriate calorie intake Discussed appropriate weight for height and initial goal (<250lb) Follow up at next visit  Further disposition pending results of labs. Discussed med's effects and SE's.   Over 30 minutes of exam, counseling, chart review, and critical decision making was performed.   Future Appointments  Date Time Provider Department Center  08/18/2021  9:00 AM Judd Gaudier, NP [facility] None    ------------------------------------------------------------------------------------------------------------------   HPI BP 112/90   Pulse 70   Temp (!) 97.3 F (36.3 C)   Wt 276 lb (125.2 kg)   SpO2 99%   BMI 37.69 kg/m  20 y.o.male presents for 3 month follow up on BP and obesity.   His blood pressure has been controlled at home (127-138/65-90), today their BP is BP: 112/90, similar on manual recheck.   He does workout. He denies chest pain, shortness of breath, dizziness.  Marland KitchenBMI is Body mass index is 37.69 kg/m., he has been working on diet and exercise, has cut out alcohol and most dairy. Also very low gluten. Minimal fast food. Has cut down on red meat, minimal. Salad with each dinner.  Wt Readings from Last 3 Encounters:  11/25/20 276 lb (125.2 kg)  08/12/20 289 lb 12.8 oz (131.5 kg)  05/25/19 272 lb (123.4 kg)      Past Medical History:   Diagnosis Date   Acute left-sided weakness 08/10/2015   Atypical migraine    Hypertension    Seasonal allergies      Allergies  Allergen Reactions   Zoster Vaccine Recombinant, Adjuvanted Swelling    Chicken pox vaccine   Penicillins Rash    Current Outpatient Medications on File Prior to Visit  Medication Sig   Ascorbic Acid (VITAMIN C) 500 MG CAPS Take by mouth.   Black Pepper-Turmeric 3-500 MG CAPS Take by mouth.   Garlic 300 MG TABS Take by mouth.   Iron, Ferrous Sulfate, 325 (65 Fe) MG TABS Take iron supplement 3 days week with vitamin C.   losartan (COZAAR) 50 MG tablet Take 1 tablet (50 mg total) by mouth daily.   Magnesium 200 MG TABS Take by mouth.   Multiple Vitamin (MULTIVITAMIN) tablet Take by mouth daily.   Probiotic Product (PROBIOTIC PO) Take by mouth.   QUERCETIN PO Take 500 mg by mouth daily.   VITAMIN D PO Take 5,000 Units by mouth.   vitamin E 1000 UNIT capsule Take 268 Units by mouth daily.   Zinc 30 MG TABS Take 50 mg by mouth.   No current facility-administered medications on file prior to visit.   Allergies:  Allergies  Allergen Reactions   Zoster Vaccine Recombinant, Adjuvanted Swelling    Chicken pox vaccine   Penicillins Rash   Medical History:  has Atypical migraine; Hypertension; Medication management; Vitamin D deficiency; Verruca; and Obesity (BMI 30.0-34.9) on their problem list. Surgical History:  He  has a past surgical history  that includes Circumcision. Family History:  Hisfamily history includes CVA in his paternal grandmother; Cancer in his paternal grandfather and paternal grandmother; Cancer (age of onset: 65) in his paternal uncle; Dementia in his paternal grandmother; Gout in his father; Heart disease in his paternal grandmother; Hyperlipidemia in his father; Hypertension in his mother; Kidney disease in his paternal grandfather; Lung cancer in his maternal grandfather; Migraines in his mother; Stroke in his paternal  grandfather. Social History:   reports that he has never smoked. He has never used smokeless tobacco. He reports current alcohol use of about 3.0 standard drinks per week. He reports that he does not use drugs.    ROS: all negative except above.   Physical Exam:  BP 112/90   Pulse 70   Temp (!) 97.3 F (36.3 C)   Wt 276 lb (125.2 kg)   SpO2 99%   BMI 37.69 kg/m   General Appearance: Well nourished, obese young male in no apparent distress. Eyes: PERRLA, conjunctiva no swelling or erythema ENT/Mouth: Mask in place; Hearing normal.  Neck: Supple, thyroid normal.  Respiratory: Respiratory effort normal, BS equal bilaterally without rales, rhonchi, wheezing or stridor.  Cardio: RRR with no MRGs. Brisk peripheral pulses without edema.  Abdomen: Soft, + BS.  Non tender. Lymphatics: Non tender without lymphadenopathy.  Musculoskeletal: No deformity, effusion, normal gait.  Skin: Warm, dry without rashes, lesions, ecchymosis.  Neuro: Normal muscle tone Psych: Awake and oriented X 3, normal affect, Insight and Judgment appropriate.     Dan Maker, NP 9:41 AM [facility]

## 2021-02-27 ENCOUNTER — Ambulatory Visit: Payer: BC Managed Care – PPO | Admitting: Nurse Practitioner

## 2021-02-27 NOTE — Progress Notes (Deleted)
Assessment and Plan:  There are no diagnoses linked to this encounter.    Further disposition pending results of labs. Discussed med's effects and SE's.   Over 30 minutes of exam, counseling, chart review, and critical decision making was performed.   Future Appointments  Date Time Provider Department Center  02/27/2021  4:00 PM Revonda Humphrey, NP [facility] None  08/18/2021  9:00 AM Judd Gaudier, NP [facility] None    ------------------------------------------------------------------------------------------------------------------   HPI There were no vitals taken for this visit. 20 y.o.male presents for  Past Medical History:  Diagnosis Date   Acute left-sided weakness 08/10/2015   Atypical migraine    Hypertension    Seasonal allergies      Allergies  Allergen Reactions   Zoster Vaccine Recombinant, Adjuvanted Swelling    Chicken pox vaccine   Penicillins Rash    Current Outpatient Medications on File Prior to Visit  Medication Sig   Ascorbic Acid (VITAMIN C) 500 MG CAPS Take by mouth.   Black Pepper-Turmeric 3-500 MG CAPS Take by mouth.   Garlic 300 MG TABS Take by mouth.   Iron, Ferrous Sulfate, 325 (65 Fe) MG TABS Take iron supplement 3 days week with vitamin C.   losartan (COZAAR) 50 MG tablet Take 1 tablet (50 mg total) by mouth daily.   Magnesium 200 MG TABS Take by mouth.   Multiple Vitamin (MULTIVITAMIN) tablet Take by mouth daily.   Probiotic Product (PROBIOTIC PO) Take by mouth.   QUERCETIN PO Take 500 mg by mouth daily.   VITAMIN D PO Take 5,000 Units by mouth.   vitamin E 1000 UNIT capsule Take 268 Units by mouth daily.   Zinc 30 MG TABS Take 50 mg by mouth.   No current facility-administered medications on file prior to visit.    ROS: all negative except above.   Physical Exam:  There were no vitals taken for this visit.  General Appearance: Well nourished, in no apparent distress. Eyes: PERRLA, EOMs, conjunctiva no swelling or  erythema Sinuses: No Frontal/maxillary tenderness ENT/Mouth: Ext aud canals clear, TMs without erythema, bulging. No erythema, swelling, or exudate on post pharynx.  Tonsils not swollen or erythematous. Hearing normal.  Neck: Supple, thyroid normal.  Respiratory: Respiratory effort normal, BS equal bilaterally without rales, rhonchi, wheezing or stridor.  Cardio: RRR with no MRGs. Brisk peripheral pulses without edema.  Abdomen: Soft, + BS.  Non tender, no guarding, rebound, hernias, masses. Lymphatics: Non tender without lymphadenopathy.  Musculoskeletal: Full ROM, 5/5 strength, normal gait.  Skin: Warm, dry without rashes, lesions, ecchymosis.  Neuro: Cranial nerves intact. Normal muscle tone, no cerebellar symptoms. Sensation intact.  Psych: Awake and oriented X 3, normal affect, Insight and Judgment appropriate.     Revonda Humphrey, NP 1:00 PM [facility]

## 2021-08-14 ENCOUNTER — Encounter: Payer: BC Managed Care – PPO | Admitting: Adult Health

## 2021-08-18 ENCOUNTER — Encounter: Payer: Self-pay | Admitting: Adult Health

## 2021-08-18 ENCOUNTER — Ambulatory Visit (INDEPENDENT_AMBULATORY_CARE_PROVIDER_SITE_OTHER): Payer: Managed Care, Other (non HMO) | Admitting: Adult Health

## 2021-08-18 VITALS — BP 120/84 | HR 71 | Temp 97.7°F | Ht 72.0 in | Wt 295.2 lb

## 2021-08-18 DIAGNOSIS — Z131 Encounter for screening for diabetes mellitus: Secondary | ICD-10-CM | POA: Diagnosis not present

## 2021-08-18 DIAGNOSIS — Z130 Encounter for screening for diseases of the blood and blood-forming organs and certain disorders involving the immune mechanism: Secondary | ICD-10-CM

## 2021-08-18 DIAGNOSIS — Z0000 Encounter for general adult medical examination without abnormal findings: Secondary | ICD-10-CM

## 2021-08-18 DIAGNOSIS — Z13220 Encounter for screening for lipoid disorders: Secondary | ICD-10-CM

## 2021-08-18 DIAGNOSIS — Z1389 Encounter for screening for other disorder: Secondary | ICD-10-CM

## 2021-08-18 DIAGNOSIS — G43009 Migraine without aura, not intractable, without status migrainosus: Secondary | ICD-10-CM

## 2021-08-18 DIAGNOSIS — E559 Vitamin D deficiency, unspecified: Secondary | ICD-10-CM

## 2021-08-18 DIAGNOSIS — Z0001 Encounter for general adult medical examination with abnormal findings: Secondary | ICD-10-CM

## 2021-08-18 DIAGNOSIS — Z79899 Other long term (current) drug therapy: Secondary | ICD-10-CM | POA: Diagnosis not present

## 2021-08-18 DIAGNOSIS — D509 Iron deficiency anemia, unspecified: Secondary | ICD-10-CM

## 2021-08-18 DIAGNOSIS — I10 Essential (primary) hypertension: Secondary | ICD-10-CM

## 2021-08-18 MED ORDER — LOSARTAN POTASSIUM 25 MG PO TABS: 25.0000 mg | ORAL_TABLET | Freq: Every day | ORAL | 3 refills | Status: AC

## 2021-08-18 NOTE — Progress Notes (Signed)
Complete Physical  Assessment and Plan:   Encounter for Annual Physical Exam with abnormal findings Due annually  Health Maintenance reviewed Healthy lifestyle reviewed and goals set  - Discussed STD testing, safe sex, alcohol and drug awareness, drinking and driving dangers, wearing a seat belt and general safety measures for young adult.  Atypical migraine Rare since 2017, had very good work up, controlled by avoiding caffeine Pt states his HR is normal 50-60's will avoid BB for now  Essential hypertension Continue losartan 25 mg daily DASH diet Monitor BP at home -     CBC with Differential/Platelet -     COMPLETE METABOLIC PANEL WITH GFR -     TSH -     Urinalysis, Routine w reflex microscopic -     Microalbumin / creatinine urine ratio -     losartan (COZAAR) 50 MG tablet; Take 1 tablet (50 mg total) by mouth daily.  Screening for hematuria or proteinuria Check for proteinuria with BP -     Urinalysis, Routine w reflex microscopic -     Microalbumin / creatinine urine ratio  Medication management -     CBC with Differential/Platelet -     COMPLETE METABOLIC PANEL WITH GFR -     TSH -     Magnesium  Vitamin D deficiency -     VITAMIN D 25 Hydroxy (Vit-D Deficiency, Fractures)  History of iron deficiency anemia  Recheck levels, CBC, iron panel   Morbid obesity (HCC) - BMI 40+  Long discussion about weight loss, diet, and exercise Recommended diet heavy in fruits and veggies and low in animal meats, cheeses, and dairy products, appropriate calorie intake Patient will work on reducing processed carb, eating out Focus on resistance exercises Monitor waist circumference, fat % if aggressively working out Discussed appropriate weight for height and initial goal (<250 lb) Follow up at next visit   Orders Placed This Encounter  Procedures   CBC with Differential/Platelet   COMPLETE METABOLIC PANEL WITH GFR   Magnesium   VITAMIN D 25 Hydroxy (Vit-D Deficiency,  Fractures)   Iron, TIBC and Ferritin Panel   Microalbumin / creatinine urine ratio   Urinalysis, Routine w reflex microscopic   Insulin, random     Discussed med's effects and SE's. Screening labs and tests as requested with regular follow-up as recommended. Over 40 minutes of exam, counseling, chart review and critical decision making was performed  Future Appointments  Date Time Provider [place]  08/20/2022  9:00 AM Mull, Townsend Roger, NP [facility] None     HPI 21 y.o. male patient presents for CPE. He has Atypical migraine; Hypertension; Medication management; Vitamin D deficiency; Verruca; and Morbid obesity with BMI of 40.0-44.9, adult ([place]) on their problem list.  He is dating, 3 years, declines STD testing. Planning to get engaged soon, she is off of birth control, receptive to a child together. He is now an Clinical biochemist, good job. No concerns.   He has a history of atypical migraine in the past and has seen Dr. Gaynell Face, ped neuro in 2017.  In 2017, he had left sided hemiplegia and hemiparesis with a headache, had negative head CT, CT angio neck/head, and MRI/MRA. Negative coagulation study and thrombophilia work up at that time, determined to be a complex migraine. Normal echo. He reports improved with reduced caffeine, typically 1-2 per year. Will resolve with rest.   BMI is Body mass index is 40.04 kg/m., he has been working on diet and exercise. Admits eats  out too much when at work, working to [employer] to the gym 3 days a week and getting 10000 steps daily  [place] when he meal preps/packs his lunch Generally healthy when prepping  Has 1 sugar sweetened tea with lunch, drinks water otherwise Wt Readings from Last 3 Encounters:  08/18/21 295 lb 3.2 oz (133.9 kg)  11/25/20 276 lb (125.2 kg)  08/12/20 289 lb 12.8 oz (131.5 kg)   His blood pressure has been controlled at home, taking losartan 25 mg, today their BP is BP: 120/84  He does workout. He denies  chest pain, shortness of breath, dizziness.  He is not on cholesterol medication and denies myalgias. The cholesterol last visit was:   Lab Results  Component Value Date   CHOL 133 04/29/2019   HDL 46 04/29/2019   LDLCALC 68 04/29/2019   TRIG 108 04/29/2019   CHOLHDL 2.9 04/29/2019    He has been working on diet and exercise for glucose management, and denies increased appetite, nausea, paresthesia of the feet, polydipsia, polyuria and visual disturbances. Last A1C in the office was:  Lab Results  Component Value Date   HGBA1C 5.1 04/29/2019   Patient is on Vitamin D supplement, he is taking 5000 IU   Lab Results  Component Value Date   VD25OH 65 08/12/2020     Lab Results  Component Value Date   WBC 7.7 08/12/2020   HGB 16.0 08/12/2020   HCT 47.2 08/12/2020   MCV 86.4 08/12/2020   PLT 266 08/12/2020   Reports has been taking 45 mg fe iron 3 days a week  Lab Results  Component Value Date   IRON 138 08/12/2020   TIBC 331 08/12/2020   FERRITIN 182 08/12/2020    Current Medications:  Current Outpatient Medications on File Prior to Visit  Medication Sig Dispense Refill   Ascorbic Acid (VITAMIN C) 500 MG CAPS Take by mouth.     Black Pepper-Turmeric 3-500 MG CAPS Take by mouth.     Garlic 786 MG TABS Take by mouth.     Iron, Ferrous Sulfate, 325 (65 Fe) MG TABS Take iron supplement 3 days week with vitamin C. 30 tablet    Magnesium 200 MG TABS Take by mouth.     Multiple Vitamin (MULTIVITAMIN) tablet Take by mouth daily.     Probiotic Product (PROBIOTIC PO) Take by mouth.     QUERCETIN PO Take 500 mg by mouth daily.     VITAMIN D PO Take 5,000 Units by mouth.     vitamin E 1000 UNIT capsule Take 268 Units by mouth daily.     Zinc 30 MG TABS Take 50 mg by mouth.     No current facility-administered medications on file prior to visit.   Allergies:  Allergies  Allergen Reactions   Zoster Vaccine Recombinant, Adjuvanted Swelling    Chicken pox vaccine   Penicillins  Rash   Health Maintenance:  Immunization History  Administered Date(s) Administered   DTaP 05/02/1999, 07/03/1999, 03/01/2000, 05/16/2000, 09/05/2004   HPV Quadrivalent 12/10/2012   Hepatitis A 11/28/2007, 02/03/2009   Hepatitis B 03/17/1999, 06/02/1999, 12/29/1999   HiB (PRP-OMP) 03/17/1999, 06/02/1999, 12/29/1999   IPV 03/02/1999, 05/02/1999, 07/03/1999, 09/05/2004   MMR 12/29/1999, 09/05/2004   Pneumococcal Conjugate-13 05/16/2000   Pneumococcal-Unspecified 03/17/1999, 06/02/1999   Tdap 02/03/2009, 04/29/2019   Varicella 12/29/1999, 11/28/2007   Health Maintenance  Topic Date Due   COVID-19 Vaccine (1) 09/03/2021 (Originally 06/21/1999)   INFLUENZA VACCINE  10/31/2021  TETANUS/TDAP  04/28/2029   HPV VACCINES  Discontinued   Hepatitis C Screening  Discontinued   HIV Screening  Discontinued   Health Maintenance  Topic Date Due   COVID-19 Vaccine (1) 09/03/2021 (Originally 06/21/1999)   INFLUENZA VACCINE  10/31/2021   TETANUS/TDAP  04/28/2029   HPV VACCINES  Discontinued   Hepatitis C Screening  Discontinued   HIV Screening  Discontinued   Flu vaccine: declines  Covid 19: declines  HPV: 2014  Eye Exam: [facility], last 2023, no contacts or glasses Dentist: Dr. Felipa Eth, goes q13m Patient Care Team: MUnk Pinto MD as PCP - General (Internal Medicine)  Medical History:  has Atypical migraine; Hypertension; Medication management; Vitamin D deficiency; Verruca; and Morbid obesity with BMI of 40.0-44.9, [place]. Surgical History:  He  has a past surgical history that includes Circumcision. Family History:  His family history includes CVA in his paternal grandmother; Cancer in his paternal grandfather and paternal grandmother; Cancer (age of onset: 4110 in his paternal uncle; Dementia in his paternal grandmother; Gout in his father; Heart disease in his paternal grandmother; Hyperlipidemia in his father; Hypertension in his mother; Kidney  disease in his paternal grandfather; Lung cancer in his maternal grandfather; Migraines in his mother; Stroke in his paternal grandfather. Social History:   reports that he has never smoked. He has never used smokeless tobacco. He reports that he does not currently use alcohol. He reports that he does not use drugs.  Review of Systems:  Review of Systems  Constitutional:  Negative for malaise/fatigue and weight loss.  HENT:  Negative for hearing loss and tinnitus.   Eyes:  Negative for blurred vision and double vision.  Respiratory:  Negative for cough, shortness of breath and wheezing.   Cardiovascular:  Negative for chest pain, palpitations, orthopnea, claudication and leg swelling.  Gastrointestinal:  Negative for abdominal pain, blood in stool, constipation, diarrhea, heartburn, melena, nausea and vomiting.  Genitourinary: Negative.   Musculoskeletal:  Negative for joint pain and myalgias.  Skin:  Negative for rash.  Neurological:  Negative for dizziness, tingling, sensory change, weakness and headaches.  Endo/Heme/Allergies:  Negative for polydipsia.  Psychiatric/Behavioral: Negative.    All other systems reviewed and are negative.  Physical Exam: Estimated body mass index is 40.04 kg/m as calculated from the following:   Height as of this encounter: 6' (1.829 m).   Weight as of this encounter: 295 lb 3.2 oz (133.9 kg). BP 120/84   Pulse 71   Temp 97.7 F (36.5 C)   Ht 6' (1.829 m)   Wt 295 lb 3.2 oz (133.9 kg)   SpO2 99%   BMI 40.04 kg/m  General Appearance: Well nourished, in no apparent distress.  Eyes: PERRLA, EOMs, conjunctiva no swelling or erythema Sinuses: No Frontal/maxillary tenderness  ENT/Mouth: Ext aud canals clear, normal light reflex with TMs without erythema, bulging. Good dentition. No erythema, swelling, or exudate on post pharynx. Tonsils not swollen or erythematous. Hearing normal.  Neck: Supple, thyroid normal. No bruits  Respiratory: Respiratory  effort normal, BS equal bilaterally without rales, rhonchi, wheezing or stridor.  Cardio: RRR without murmurs, rubs or gallops. Brisk peripheral pulses without edema Chest: symmetric, with normal excursions and percussion.  Abdomen: Soft, obese, nontender, no guarding, rebound, hernias, masses, or organomegaly.  Lymphatics: Non tender without lymphadenopathy.  Genitourinary: no concerns, defer Musculoskeletal: Full ROM all peripheral extremities,5/5 strength, and normal gait.  Skin: Warm, dry without rashes,  Ecchymosis. Several mall benign  appearing nevi to back, mild acne to back.  Mild folliculitis to chest. Well healed horizontal scar to left wrist.  Neuro: Cranial nerves intact, reflexes equal bilaterally. Normal muscle tone, no cerebellar symptoms. Sensation intact.  Psych: Awake and oriented X 3, normal affect, Insight and Judgment appropriate.   EKG: WNL 2021, defer  Izora Ribas, DNP, AGNP-C 9:45 AM [facility]

## 2021-08-18 NOTE — Patient Instructions (Signed)
Keith Singh , Thank you for taking time to come for your Annual Wellness Visit. I appreciate your ongoing commitment to your health goals. Please review the following plan we discussed and let me know if I can assist you in the future.   These are the goals we discussed:  Goals      Blood Pressure < 120/80     DIET - DECREASE SODA OR JUICE INTAKE     Weight (lb) < 250 lb (113.4 kg)        This is a list of the screening recommended for you and due dates:  Health Maintenance  Topic Date Due   COVID-19 Vaccine (1) 09/03/2021*   Flu Shot  10/31/2021   Tetanus Vaccine  04/28/2029   HPV Vaccine  Discontinued   Hepatitis C Screening: USPSTF Recommendation to screen - Ages 18-79 yo.  Discontinued   HIV Screening  Discontinued  *Topic was postponed. The date shown is not the original due date.     Know what a healthy weight is for you (can use waist circumference and or hand held fat % monitor)   Aim for 7+ servings of fruits and vegetables daily  80+ fluid ounces of water or unsweet tea for healthy kidneys  Limit to max 1 drink of alcohol per day; avoid smoking/tobacco  Limit animal fats in diet for cholesterol and heart health - choose grass fed whenever available  Avoid highly processed foods, and foods high in saturated/trans fats  Aim for low stress - take time to unwind and care for your mental health  Aim for 150 min of moderate intensity exercise weekly for heart health, and weights twice weekly for bone health  Aim for 7-9 hours of sleep daily     High-Fiber Eating Plan Fiber, also called dietary fiber, is a type of carbohydrate. It is found foods such as fruits, vegetables, whole grains, and beans. A high-fiber diet can have many health benefits. Your health care provider may recommend a high-fiber diet to help: Prevent constipation. Fiber can make your bowel movements more regular. Lower your cholesterol. Relieve the following conditions: Inflammation of veins in  the anus (hemorrhoids). Inflammation of specific areas of the digestive tract (uncomplicated diverticulosis). A problem of the large intestine, also called the colon, that sometimes causes pain and diarrhea (irritable bowel syndrome, or IBS). Prevent overeating as part of a weight-loss plan. Prevent heart disease, type 2 diabetes, and certain cancers. What are tips for following this plan? Reading food labels  Check the nutrition facts label on food products for the amount of dietary fiber. Choose foods that have 5 grams of fiber or more per serving. The goals for recommended daily fiber intake include: Men (age 23 or younger): 34-38 g. Men (over age 19): 28-34 g. Women (age 50 or younger): 25-28 g. Women (over age 39): 22-25 g. Your daily fiber goal is _____________ g. Shopping Choose whole fruits and vegetables instead of processed forms, such as apple juice or applesauce. Choose a wide variety of high-fiber foods such as avocados, lentils, oats, and kidney beans. Read the nutrition facts label of the foods you choose. Be aware of foods with added fiber. These foods often have high sugar and sodium amounts per serving. Cooking Use whole-grain flour for baking and cooking. Cook with brown rice instead of white rice. Meal planning Start the day with a breakfast that is high in fiber, such as a cereal that contains 5 g of fiber or more per serving.  Eat breads and cereals that are made with whole-grain flour instead of refined flour or white flour. Eat brown rice, bulgur wheat, or millet instead of white rice. Use beans in place of meat in soups, salads, and pasta dishes. Be sure that half of the grains you eat each day are whole grains. General information You can get the recommended daily intake of dietary fiber by: Eating a variety of fruits, vegetables, grains, nuts, and beans. Taking a fiber supplement if you are not able to take in enough fiber in your diet. It is better to get  fiber through food than from a supplement. Gradually increase how much fiber you consume. If you increase your intake of dietary fiber too quickly, you may have bloating, cramping, or gas. Drink plenty of water to help you digest fiber. Choose high-fiber snacks, such as berries, raw vegetables, nuts, and popcorn. What foods should I eat? Fruits Berries. Pears. Apples. Oranges. Avocado. Prunes and raisins. Dried figs. Vegetables Sweet potatoes. Spinach. Kale. Artichokes. Cabbage. Broccoli. Cauliflower. Green peas. Carrots. Squash. Grains Whole-grain breads. Multigrain cereal. Oats and oatmeal. Brown rice. Barley. Bulgur wheat. Millet. Quinoa. Bran muffins. Popcorn. Rye wafer crackers. Meats and other proteins Navy beans, kidney beans, and pinto beans. Soybeans. Split peas. Lentils. Nuts and seeds. Dairy Fiber-fortified yogurt. Beverages Fiber-fortified soy milk. Fiber-fortified orange juice. Other foods Fiber bars. The items listed above may not be a complete list of recommended foods and beverages. Contact a dietitian for more information. What foods should I avoid? Fruits Fruit juice. Cooked, strained fruit. Vegetables Fried potatoes. Canned vegetables. Well-cooked vegetables. Grains White bread. Pasta made with refined flour. White rice. Meats and other proteins Fatty cuts of meat. Fried chicken or fried fish. Dairy Milk. Yogurt. Cream cheese. Sour cream. Fats and oils Butters. Beverages Soft drinks. Other foods Cakes and pastries. The items listed above may not be a complete list of foods and beverages to avoid. Talk with your dietitian about what choices are best for you. Summary Fiber is a type of carbohydrate. It is found in foods such as fruits, vegetables, whole grains, and beans. A high-fiber diet has many benefits. It can help to prevent constipation, lower blood cholesterol, aid weight loss, and reduce your risk of heart disease, diabetes, and certain  cancers. Increase your intake of fiber gradually. Increasing fiber too quickly may cause cramping, bloating, and gas. Drink plenty of water while you increase the amount of fiber you consume. The best sources of fiber include whole fruits and vegetables, whole grains, nuts, seeds, and beans. This information is not intended to replace advice given to you by your health care provider. Make sure you discuss any questions you have with your health care provider. Document Revised: 07/23/2019 Document Reviewed: 07/23/2019 Elsevier Patient Education  2023 [employer].

## 2021-08-19 ENCOUNTER — Other Ambulatory Visit: Payer: Self-pay | Admitting: Adult Health

## 2021-08-19 DIAGNOSIS — R7989 Other specified abnormal findings of blood chemistry: Secondary | ICD-10-CM

## 2021-08-21 LAB — MICROALBUMIN / CREATININE URINE RATIO
Creatinine, Urine: 21 mg/dL (ref 20–320)
Microalb, Ur: <0.2 mg/dL

## 2021-08-21 LAB — COMPLETE METABOLIC PANEL WITH GFR
AG Ratio: 1.7 (calc) (ref 1.0–2.5)
ALT: 56 U/L — ABNORMAL HIGH (ref 9–46)
AST: 26 U/L (ref 10–40)
Albumin: 4.4 g/dL (ref 3.6–5.1)
Alkaline phosphatase (APISO): 60 U/L (ref 36–130)
BUN: 11 mg/dL (ref 7–25)
CO2: 26 mmol/L (ref 20–32)
Calcium: 9.6 mg/dL (ref 8.6–10.3)
Chloride: 105 mmol/L (ref 98–110)
Creat: 0.79 mg/dL (ref 0.60–1.24)
Globulin: 2.6 g/dL (calc) (ref 1.9–3.7)
Glucose, Bld: 81 mg/dL (ref 65–99)
Potassium: 4.6 mmol/L (ref 3.5–5.3)
Sodium: 141 mmol/L (ref 135–146)
Total Bilirubin: 0.6 mg/dL (ref 0.2–1.2)
Total Protein: 7 g/dL (ref 6.1–8.1)
eGFR: 129 mL/min/{1.73_m2} (ref >=60)

## 2021-08-21 LAB — INSULIN, RANDOM: Insulin: 17.4 u[IU]/mL

## 2021-08-21 LAB — URINALYSIS, ROUTINE W REFLEX MICROSCOPIC
Bilirubin Urine: NEGATIVE
Glucose, UA: NEGATIVE
Hgb urine dipstick: NEGATIVE
Ketones, ur: NEGATIVE
Leukocytes,Ua: NEGATIVE
Nitrite: NEGATIVE
Protein, ur: NEGATIVE
Specific Gravity, Urine: 1.004 (ref 1.001–1.035)
pH: 8 (ref 5.0–8.0)

## 2021-08-21 LAB — CBC WITH DIFFERENTIAL/PLATELET
Absolute Monocytes: 806 cells/uL (ref 200–950)
Basophils Absolute: 53 cells/uL (ref 0–200)
Basophils Relative: 0.7 %
Eosinophils Absolute: 152 cells/uL (ref 15–500)
Eosinophils Relative: 2 %
HCT: 46 % (ref 38.5–50.0)
Hemoglobin: 15.9 g/dL (ref 13.2–17.1)
Lymphs Abs: 2227 cells/uL (ref 850–3900)
MCH: 29.9 pg (ref 27.0–33.0)
MCHC: 34.6 g/dL (ref 32.0–36.0)
MCV: 86.5 fL (ref 80.0–100.0)
MPV: 10.6 fL (ref 7.5–12.5)
Monocytes Relative: 10.6 %
Neutro Abs: 4362 cells/uL (ref 1500–7800)
Neutrophils Relative %: 57.4 %
Platelets: 284 10*3/uL (ref 140–400)
RBC: 5.32 10*6/uL (ref 4.20–5.80)
RDW: 12 % (ref 11.0–15.0)
Total Lymphocyte: 29.3 %
WBC: 7.6 10*3/uL (ref 3.8–10.8)

## 2021-08-21 LAB — IRON,TIBC AND FERRITIN PANEL
%SAT: 36 % (calc) (ref 20–48)
Ferritin: 211 ng/mL (ref 38–380)
Iron: 118 ug/dL (ref 50–195)
TIBC: 324 mcg/dL (calc) (ref 250–425)

## 2021-08-21 LAB — MAGNESIUM: Magnesium: 2.2 mg/dL (ref 1.5–2.5)

## 2021-08-21 LAB — VITAMIN D 25 HYDROXY (VIT D DEFICIENCY, FRACTURES): Vit D, 25-Hydroxy: 57 ng/mL (ref 30–100)

## 2022-08-15 NOTE — Progress Notes (Deleted)
Assessment and Plan:  There are no diagnoses linked to this encounter.    Further disposition pending results of labs. Discussed med's effects and SE's.   Over 30 minutes of exam, counseling, chart review, and critical decision making was performed.   Future Appointments  Date Time Provider Department Center  08/16/2022 11:00 AM Raynelle Dick, NP [facility] None  08/20/2022  9:00 AM Raynelle Dick, NP [facility] None    ------------------------------------------------------------------------------------------------------------------   HPI There were no vitals taken for this visit. 22 y.o.male presents for  Past Medical History:  Diagnosis Date   Acute left-sided weakness 08/10/2015   Atypical migraine    Hypertension    Seasonal allergies      Allergies  Allergen Reactions   Zoster Vaccine Recombinant, Adjuvanted Swelling    Chicken pox vaccine   Penicillins Rash    Current Outpatient Medications on File Prior to Visit  Medication Sig   Ascorbic Acid (VITAMIN C) 500 MG CAPS Take by mouth.   Black Pepper-Turmeric 3-500 MG CAPS Take by mouth.   Garlic 300 MG TABS Take by mouth.   losartan (COZAAR) 25 MG tablet Take 1 tablet (25 mg total) by mouth daily.   Magnesium 200 MG TABS Take by mouth.   Multiple Vitamin (MULTIVITAMIN) tablet Take by mouth daily.   Probiotic Product (PROBIOTIC PO) Take by mouth.   QUERCETIN PO Take 500 mg by mouth daily.   VITAMIN D PO Take 5,000 Units by mouth.   vitamin E 1000 UNIT capsule Take 268 Units by mouth daily.   Zinc 30 MG TABS Take 50 mg by mouth.   No current facility-administered medications on file prior to visit.    ROS: all negative except above.   Physical Exam:  There were no vitals taken for this visit.  General Appearance: Well nourished, in no apparent distress. Eyes: PERRLA, EOMs, conjunctiva no swelling or erythema Sinuses: No Frontal/maxillary tenderness ENT/Mouth: Ext aud canals clear, TMs without  erythema, bulging. No erythema, swelling, or exudate on post pharynx.  Tonsils not swollen or erythematous. Hearing normal.  Neck: Supple, thyroid normal.  Respiratory: Respiratory effort normal, BS equal bilaterally without rales, rhonchi, wheezing or stridor.  Cardio: RRR with no MRGs. Brisk peripheral pulses without edema.  Abdomen: Soft, + BS.  Non tender, no guarding, rebound, hernias, masses. Lymphatics: Non tender without lymphadenopathy.  Musculoskeletal: Full ROM, 5/5 strength, normal gait.  Skin: Warm, dry without rashes, lesions, ecchymosis.  Neuro: Cranial nerves intact. Normal muscle tone, no cerebellar symptoms. Sensation intact.  Psych: Awake and oriented X 3, normal affect, Insight and Judgment appropriate.     Raynelle Dick, NP 4:03 PM [facility]

## 2022-08-16 ENCOUNTER — Ambulatory Visit: Payer: BC Managed Care – PPO | Admitting: Nurse Practitioner

## 2022-08-16 ENCOUNTER — Ambulatory Visit
Admission: RE | Admit: 2022-08-16 | Discharge: 2022-08-16 | Disposition: A | Discharge status: Home / Self Care | Payer: BC Managed Care – PPO | Source: Ambulatory Visit | Attending: Urgent Care | Admitting: Urgent Care

## 2022-08-16 VITALS — BP 156/102 | HR 77 | Temp 98.9°F | Resp 16

## 2022-08-16 DIAGNOSIS — B9789 Other viral agents as the cause of diseases classified elsewhere: Secondary | ICD-10-CM | POA: Diagnosis not present

## 2022-08-16 DIAGNOSIS — J309 Allergic rhinitis, unspecified: Secondary | ICD-10-CM

## 2022-08-16 DIAGNOSIS — J988 Other specified respiratory disorders: Secondary | ICD-10-CM

## 2022-08-16 MED ORDER — PREDNISONE 10 MG PO TABS: 30.0000 mg | ORAL_TABLET | Freq: Every day | ORAL | 0 refills | Status: DC

## 2022-08-16 MED ORDER — CETIRIZINE HCL 10 MG PO TABS: 10.0000 mg | ORAL_TABLET | Freq: Every day | ORAL | 0 refills | Status: DC

## 2022-08-16 MED ORDER — IPRATROPIUM BROMIDE 0.03 % NA SOLN: 2.0000 | Freq: Two times a day (BID) | NASAL | 0 refills | Status: DC

## 2022-08-16 NOTE — Progress Notes (Deleted)
Complete Physical  Assessment and Plan:   Encounter for Annual Physical Exam with abnormal findings Due annually  Health Maintenance reviewed Healthy lifestyle reviewed and goals set  - Discussed STD testing, safe sex, alcohol and drug awareness, drinking and driving dangers, wearing a seat belt and general safety measures for young adult.  Atypical migraine Rare since 2017, had very good work up, controlled by avoiding caffeine Pt states his HR is normal 50-60's will avoid BB for now  Essential hypertension Continue losartan 25 mg daily DASH diet Monitor BP at home -     CBC with Differential/Platelet -     COMPLETE METABOLIC PANEL WITH GFR -     TSH -     Urinalysis, Routine w reflex microscopic -     Microalbumin / creatinine urine ratio -     losartan (COZAAR) 50 MG tablet; Take 1 tablet (50 mg total) by mouth daily.  Screening for hematuria or proteinuria Check for proteinuria with BP -     Urinalysis, Routine w reflex microscopic -     Microalbumin / creatinine urine ratio  Screening for lipid disorder - Lipid panel  Screening for diabetes - A1c  Medication management -     CBC with Differential/Platelet -     COMPLETE METABOLIC PANEL WITH GFR -     TSH -     Magnesium  Vitamin D deficiency Continue Vit D supplementation to maintain value in therapeutic level of 60-100  -     VITAMIN D 25 Hydroxy (Vit-D Deficiency, Fractures)  History of iron deficiency anemia  Recheck levels, CBC, iron panel   Morbid obesity (HCC) - BMI 40+  Long discussion about weight loss, diet, and exercise Recommended diet heavy in fruits and veggies and low in animal meats, cheeses, and dairy products, appropriate calorie intake Patient will work on reducing processed carb, eating out Focus on resistance exercises Monitor waist circumference, fat % if aggressively working out Discussed appropriate weight for height and initial goal (<250 lb) Follow up at next visit  Elevated  LFT's - CMP - Avoid Tylenol and alcohol - Focus on diet, exercise and weight loss     Discussed med's effects and SE's. Screening labs and tests as requested with regular follow-up as recommended. Over 40 minutes of exam, counseling, chart review and critical decision making was performed  Future Appointments  Date Time Provider Department Center  08/16/2022  1:45 PM [facility] PROVIDER [facility] None  08/20/2022  9:00 AM Raynelle Dick, NP [facility] None     HPI 22 y.o. male patient presents for CPE. He has Atypical migraine; Hypertension; Medication management; Vitamin D deficiency; Verruca; Morbid obesity with BMI of 40.0-44.9, adult (HCC); and Elevated LFTs on their problem list.  He is dating, 3 years, declines STD testing. Planning to get engaged soon, she is off of birth control, receptive to a child together. He is now an Personnel officer, good job. No concerns.   He has a history of atypical migraine in the past and has seen Dr. Sharene Skeans, ped neuro in 2017.  In 2017, he had left sided hemiplegia and hemiparesis with a headache, had negative head CT, CT angio neck/head, and MRI/MRA. Negative coagulation study and thrombophilia work up at that time, determined to be a complex migraine. Normal echo. He reports improved with reduced caffeine, typically 1-2 per year. Will resolve with rest.   BMI is There is no height or weight on file to calculate BMI., he has been working on  diet and exercise. Admits eats out too much when at work, working to [employer] to the gym 3 days a week and getting 16109 steps daily  Tracks food when he meal preps/packs his lunch Generally healthy when prepping  Has 1 sugar sweetened tea with lunch, drinks water otherwise Wt Readings from Last 3 Encounters:  08/18/21 295 lb 3.2 oz (133.9 kg)  11/25/20 276 lb (125.2 kg)  08/12/20 289 lb 12.8 oz (131.5 kg)   His blood pressure has been controlled at home, taking losartan 25 mg, today their BP is    He  does workout. He denies chest pain, shortness of breath, dizziness.  He is not on cholesterol medication and denies myalgias. The cholesterol last visit was:   Lab Results  Component Value Date   CHOL 133 04/29/2019   HDL 46 04/29/2019   LDLCALC 68 04/29/2019   TRIG 108 04/29/2019   CHOLHDL 2.9 04/29/2019    He has been working on diet and exercise for glucose management, and denies increased appetite, nausea, paresthesia of the feet, polydipsia, polyuria and visual disturbances. Last A1C in the office was:  Lab Results  Component Value Date   HGBA1C 5.1 04/29/2019   Patient is on Vitamin D supplement, he is taking 5000 IU   Lab Results  Component Value Date   VD25OH 57 08/18/2021     Lab Results  Component Value Date   WBC 7.6 08/18/2021   HGB 15.9 08/18/2021   HCT 46.0 08/18/2021   MCV 86.5 08/18/2021   PLT 284 08/18/2021   Reports has been taking 45 mg fe iron 3 days a week  Lab Results  Component Value Date   IRON 118 08/18/2021   TIBC 324 08/18/2021   FERRITIN 211 08/18/2021    Current Medications:  Current Outpatient Medications on File Prior to Visit  Medication Sig Dispense Refill   Ascorbic Acid (VITAMIN C) 500 MG CAPS Take by mouth.     Black Pepper-Turmeric 3-500 MG CAPS Take by mouth.     Garlic 300 MG TABS Take by mouth.     losartan (COZAAR) 25 MG tablet Take 1 tablet (25 mg total) by mouth daily. 90 tablet 3   Magnesium 200 MG TABS Take by mouth.     Multiple Vitamin (MULTIVITAMIN) tablet Take by mouth daily.     Probiotic Product (PROBIOTIC PO) Take by mouth.     QUERCETIN PO Take 500 mg by mouth daily.     VITAMIN D PO Take 5,000 Units by mouth.     vitamin E 1000 UNIT capsule Take 268 Units by mouth daily.     Zinc 30 MG TABS Take 50 mg by mouth.     No current facility-administered medications on file prior to visit.   Allergies:  Allergies  Allergen Reactions   Zoster Vaccine Recombinant, Adjuvanted Swelling    Chicken pox vaccine    Penicillins Rash   Health Maintenance:  Immunization History  Administered Date(s) Administered   DTaP 05/02/1999, 07/03/1999, 03/01/2000, 05/16/2000, 09/05/2004   HIB (PRP-OMP) 03/17/1999, 06/02/1999, 12/29/1999   HPV Quadrivalent 12/10/2012   Hepatitis A 11/28/2007, 02/03/2009   Hepatitis B 03/17/1999, 06/02/1999, 12/29/1999   IPV 03/02/1999, 05/02/1999, 07/03/1999, 09/05/2004   MMR 12/29/1999, 09/05/2004   Pneumococcal Conjugate-13 05/16/2000   Pneumococcal-Unspecified 03/17/1999, 06/02/1999   Tdap 02/03/2009, 04/29/2019   Varicella 12/29/1999, 11/28/2007   Health Maintenance  Topic Date Due   COVID-19 Vaccine (1) Never done   INFLUENZA VACCINE  11/01/2022  DTaP/Tdap/Td (7 - Td or Tdap) 04/28/2029   HPV VACCINES  Discontinued   Hepatitis C Screening  Discontinued   HIV Screening  Discontinued   Health Maintenance  Topic Date Due   COVID-19 Vaccine (1) Never done   INFLUENZA VACCINE  11/01/2022   DTaP/Tdap/Td (7 - Td or Tdap) 04/28/2029   HPV VACCINES  Discontinued   Hepatitis C Screening  Discontinued   HIV Screening  Discontinued   Flu vaccine: declines  Covid 19: declines  HPV: 2014  Eye Exam: [facility], last 2023, no contacts or glasses Dentist: Dr. Denyse Amass, goes q37m  Patient Care Team: Lucky Cowboy, MD as PCP - General (Internal Medicine)  Medical History:  has Atypical migraine; Hypertension; Medication management; Vitamin D deficiency; Verruca; Morbid obesity with BMI of 40.0-44.9, adult (HCC); and Elevated LFTs on their problem list. Surgical History:  He  has a past surgical history that includes Circumcision. Family History:  His family history includes CVA in his paternal grandmother; Cancer in his paternal grandfather and paternal grandmother; Cancer (age of onset: 50) in his paternal uncle; Dementia in his paternal grandmother; Gout in his father; Heart disease in his paternal grandmother; Hyperlipidemia in his father; Hypertension in his  mother; Kidney disease in his paternal grandfather; Lung cancer in his maternal grandfather; Migraines in his mother; Stroke in his paternal grandfather. Social History:   reports that he has never smoked. He has never used smokeless tobacco. He reports that he does not currently use alcohol. He reports that he does not use drugs.  Review of Systems:  Review of Systems  Constitutional:  Negative for malaise/fatigue and weight loss.  HENT:  Negative for hearing loss and tinnitus.   Eyes:  Negative for blurred vision and double vision.  Respiratory:  Negative for cough, shortness of breath and wheezing.   Cardiovascular:  Negative for chest pain, palpitations, orthopnea, claudication and leg swelling.  Gastrointestinal:  Negative for abdominal pain, blood in stool, constipation, diarrhea, heartburn, melena, nausea and vomiting.  Genitourinary: Negative.   Musculoskeletal:  Negative for joint pain and myalgias.  Skin:  Negative for rash.  Neurological:  Negative for dizziness, tingling, sensory change, weakness and headaches.  Endo/Heme/Allergies:  Negative for polydipsia.  Psychiatric/Behavioral: Negative.    All other systems reviewed and are negative.   Physical Exam: Estimated body mass index is 40.04 kg/m as calculated from the following:   Height as of 08/18/21: 6' (1.829 m).   Weight as of 08/18/21: 295 lb 3.2 oz (133.9 kg). There were no vitals taken for this visit. General Appearance: Well nourished, in no apparent distress.  Eyes: PERRLA, EOMs, conjunctiva no swelling or erythema Sinuses: No Frontal/maxillary tenderness  ENT/Mouth: Ext aud canals clear, normal light reflex with TMs without erythema, bulging. Good dentition. No erythema, swelling, or exudate on post pharynx. Tonsils not swollen or erythematous. Hearing normal.  Neck: Supple, thyroid normal. No bruits  Respiratory: Respiratory effort normal, BS equal bilaterally without rales, rhonchi, wheezing or stridor.   Cardio: RRR without murmurs, rubs or gallops. Brisk peripheral pulses without edema Chest: symmetric, with normal excursions and percussion.  Abdomen: Soft, obese, nontender, no guarding, rebound, hernias, masses, or organomegaly.  Lymphatics: Non tender without lymphadenopathy.  Genitourinary: no concerns, defer Musculoskeletal: Full ROM all peripheral extremities,5/5 strength, and normal gait.  Skin: Warm, dry without rashes,  Ecchymosis. Several mall benign appearing nevi to back, mild acne to back.  Mild folliculitis to chest. Well healed horizontal scar to left wrist.  Neuro:  Cranial nerves intact, reflexes equal bilaterally. Normal muscle tone, no cerebellar symptoms. Sensation intact.  Psych: Awake and oriented X 3, normal affect, Insight and Judgment appropriate.   EKG: WNL 2021, defer  Keith Anding Hollie Salk, DNP, AGNP-C 12:38 PM [facility]

## 2022-08-16 NOTE — ED Provider Notes (Signed)
[facility]  Note:  This document was prepared using Conservation officer, historic buildings and may include unintentional dictation errors.  MRN: [id number] DOB: 03/31/2001  Subjective:   Keith Singh is a 22 y.o. male presenting for a history of acute onset throat pain, right-sided sinus pain, sinus congestion and pressure, body pains.  No cough, chest pain, shortness of breath, wheezing, fever.  No history of asthma.  Has a history of seasonal allergies.  Has been using Mucinex with minimal relief.  No smoking of any kind including cigarettes, cigars, vaping, marijuana use.    No current facility-administered medications for this encounter.  Current Outpatient Medications:    Ascorbic Acid (VITAMIN C) 500 MG CAPS, Take by mouth., Disp: , Rfl:    Black Pepper-Turmeric 3-500 MG CAPS, Take by mouth., Disp: , Rfl:    Garlic 300 MG TABS, Take by mouth., Disp: , Rfl:    losartan (COZAAR) 25 MG tablet, Take 1 tablet (25 mg total) by mouth daily., Disp: 90 tablet, Rfl: 3   Magnesium 200 MG TABS, Take by mouth., Disp: , Rfl:    Multiple Vitamin (MULTIVITAMIN) tablet, Take by mouth daily., Disp: , Rfl:    Probiotic Product (PROBIOTIC PO), Take by mouth., Disp: , Rfl:    QUERCETIN PO, Take 500 mg by mouth daily., Disp: , Rfl:    VITAMIN D PO, Take 5,000 Units by mouth., Disp: , Rfl:    vitamin E 1000 UNIT capsule, Take 268 Units by mouth daily., Disp: , Rfl:    Zinc 30 MG TABS, Take 50 mg by mouth., Disp: , Rfl:    Allergies  Allergen Reactions   Zoster Vaccine Recombinant, Adjuvanted Swelling    Chicken pox vaccine   Penicillins Rash    Past Medical History:  Diagnosis Date   Acute left-sided weakness 08/10/2015   Atypical migraine    Hypertension    Seasonal allergies      Past Surgical History:  Procedure Laterality Date   CIRCUMCISION      Family History  Problem Relation Age of Onset   Hypertension Mother    Migraines Mother    Hyperlipidemia Father     Gout Father    Cancer Paternal Grandmother        ? unsure   Heart disease Paternal Grandmother    CVA Paternal Grandmother        Upper 55s   Dementia Paternal Grandmother    Cancer Paternal Grandfather        prostate, liver   Stroke Paternal Grandfather    Kidney disease Paternal Grandfather    Lung cancer Maternal Grandfather        smoker   Cancer Paternal Uncle 11       papillary thyroid     Social History   Tobacco Use   Smoking status: Never   Smokeless tobacco: Never  Vaping Use   Vaping Use: Never used  Substance Use Topics   Alcohol use: Not Currently    Comment: occ   Drug use: No    ROS   Objective:   Vitals: BP (!) 156/102 (BP Location: Left Arm)   Pulse 77   Temp 98.9 F (37.2 C) (Oral)   Resp 16   SpO2 97%   BP Readings from Last 3 Encounters:  08/16/22 (!) 156/102  08/18/21 120/84  11/25/20 112/90   Physical Exam Constitutional:      General: He is not in acute distress.    Appearance:  Normal appearance. He is normal weight. He is not ill-appearing, toxic-appearing or diaphoretic.  HENT:     Head: Normocephalic and atraumatic.     Right Ear: Tympanic membrane, ear canal and external ear normal. No drainage, swelling or tenderness. No middle ear effusion. There is no impacted cerumen. Tympanic membrane is not erythematous or bulging.     Left Ear: Tympanic membrane, ear canal and external ear normal. No drainage, swelling or tenderness.  No middle ear effusion. There is no impacted cerumen. Tympanic membrane is not erythematous or bulging.     Nose: Nose normal. No congestion or rhinorrhea.     Mouth/Throat:     Mouth: Mucous membranes are moist.     Pharynx: No oropharyngeal exudate or posterior oropharyngeal erythema.  Eyes:     General: No scleral icterus.       Right eye: No discharge.        Left eye: No discharge.     Extraocular Movements: Extraocular movements intact.     Conjunctiva/sclera: Conjunctivae normal.   Cardiovascular:     Rate and Rhythm: Normal rate.  Pulmonary:     Effort: Pulmonary effort is normal.  Musculoskeletal:     Cervical back: Normal range of motion and neck supple. No rigidity. No muscular tenderness.  Neurological:     General: No focal deficit present.     Mental Status: He is alert and oriented to person, place, and time.  Psychiatric:        Mood and Affect: Mood normal.        Behavior: Behavior normal.     Assessment and Plan :   PDMP not reviewed this encounter.  1. Viral respiratory illness   2. Allergic rhinitis, unspecified seasonality, unspecified trigger    Patient requested aggressive management and would like to use an oral prednisone course which I feel is appropriate given his history of allergic rhinitis and current symptoms.  Discussed antibiotic stewardship and patient was agreeable.  Suspect viral URI, viral syndrome. Physical exam findings reassuring and vital signs stable for discharge. Advised supportive care, offered symptomatic relief. Counseled patient on potential for adverse effects with medications prescribed/recommended today, ER and return-to-clinic precautions discussed, patient verbalized understanding.     Wallis Bamberg, [place] 08/16/22 1610

## 2022-08-16 NOTE — Discharge Instructions (Addendum)
We will manage this as a viral respiratory illness. For sore throat or cough try using a honey-based tea. Use 3 teaspoons of honey with juice squeezed from half lemon. Place shaved pieces of ginger into 1/2-1 cup of water and warm over stove top. Then mix the ingredients and repeat every 4 hours as needed. Please take Tylenol 500mg -650mg  once every 6 hours for fevers, aches and pains. Hydrate very well with at least 2 liters (64 ounces) of water. Eat light meals such as soups (chicken and noodles, chicken wild rice, vegetable).  Do not eat any foods that you are allergic to.  Start an antihistamine like Zyrtec (10mg  daily) for postnasal drainage, sinus congestion.  You can take this together with prednisone for 5 days. Use the ipratropium nasal spray as well for the sinuses.

## 2022-08-16 NOTE — ED Triage Notes (Signed)
Pt c/o head congestion/right side sinus pain, sore throat, body aches x 2 days-denies fever-NAD-steady gait

## 2022-08-20 ENCOUNTER — Encounter: Payer: BC Managed Care – PPO | Admitting: Nurse Practitioner

## 2022-08-20 DIAGNOSIS — I10 Essential (primary) hypertension: Secondary | ICD-10-CM

## 2022-08-20 DIAGNOSIS — E559 Vitamin D deficiency, unspecified: Secondary | ICD-10-CM

## 2022-08-20 DIAGNOSIS — Z1389 Encounter for screening for other disorder: Secondary | ICD-10-CM

## 2022-08-20 DIAGNOSIS — D509 Iron deficiency anemia, unspecified: Secondary | ICD-10-CM

## 2022-08-20 DIAGNOSIS — Z1329 Encounter for screening for other suspected endocrine disorder: Secondary | ICD-10-CM

## 2022-08-20 DIAGNOSIS — Z131 Encounter for screening for diabetes mellitus: Secondary | ICD-10-CM

## 2022-08-20 DIAGNOSIS — Z13220 Encounter for screening for lipoid disorders: Secondary | ICD-10-CM

## 2022-08-20 DIAGNOSIS — Z0001 Encounter for general adult medical examination with abnormal findings: Secondary | ICD-10-CM

## 2022-08-20 DIAGNOSIS — Z79899 Other long term (current) drug therapy: Secondary | ICD-10-CM

## 2022-08-20 DIAGNOSIS — R7989 Other specified abnormal findings of blood chemistry: Secondary | ICD-10-CM

## 2022-08-20 DIAGNOSIS — G43009 Migraine without aura, not intractable, without status migrainosus: Secondary | ICD-10-CM

## 2022-11-09 ENCOUNTER — Ambulatory Visit: Payer: BC Managed Care – PPO | Admitting: Nurse Practitioner

## 2022-11-22 NOTE — Progress Notes (Signed)
Complete Physical  Assessment and Plan:   Encounter for Annual Physical Exam with abnormal findings Due annually  Health Maintenance reviewed Healthy lifestyle reviewed and goals set   Atypical migraine Rare since 2017, had very good work up, controlled by avoiding caffeine Pt states his HR is normal 50-60's will avoid BB for now  Essential hypertension Continue losartan 25 mg daily DASH diet Monitor BP at home -     CBC with Differential/Platelet -     COMPLETE METABOLIC PANEL WITH GFR -     TSH -     Urinalysis, Routine w reflex microscopic -     Microalbumin / creatinine urine ratio -     losartan (COZAAR) 50 MG tablet; Take 1 tablet (50 mg total) by mouth daily.  Screening for hematuria or proteinuria Check for proteinuria with BP -     Urinalysis, Routine w reflex microscopic -     Microalbumin / creatinine urine ratio  Medication management -     CBC with Differential/Platelet -     COMPLETE METABOLIC PANEL WITH GFR -     TSH -     Magnesium  Vitamin D deficiency -     VITAMIN D 25 Hydroxy (Vit-D Deficiency, Fractures)  History of iron deficiency anemia  Recheck levels, CBC, iron panel   Morbid obesity (HCC) - BMI 40+  Long discussion about weight loss, diet, and exercise Recommended diet heavy in fruits and veggies and low in animal meats, cheeses, and dairy products, appropriate calorie intake Patient will work on reducing saturated fats and simple carbs.  Increase lean proteins and exercise Discussed appropriate weight for height and initial goal (<250 lb) Follow up at next visit  Medication management -     CBC with Differential/Platelet -     COMPLETE METABOLIC PANEL WITH GFR -     Magnesium -     Lipid panel -     TSH -     Hemoglobin A1C w/out eAG -     VITAMIN D 25 Hydroxy (Vit-D Deficiency, Fractures) -     Iron, TIBC and Ferritin Panel -     Urinalysis, Routine w reflex microscopic  Screening for thyroid disorder -     TSH  Screening for  hematuria or proteinuria -     Urinalysis, Routine w reflex microscopic  Screening for diabetes mellitus -     Hemoglobin A1C w/out eAG  Screening, lipid -     Lipid panel  Poison ivy Start Prednisone taper and use Benadryl gel- if no improvement by early next week notify the office -     predniSONE (DELTASONE) 20 MG tablet; 3 tablets daily with food for 3 days, 2 tabs daily for 3 days, 1 tab a day for 5 days.       Discussed med's effects and SE's. Screening labs and tests as requested with regular follow-up as recommended. Over 40 minutes of exam, counseling, chart review and critical decision making was performed  Future Appointments  Date Time Provider Department Center  11/25/2023 10:00 AM Raynelle Dick, NP [facility] None     HPI 22 y.o. male patient presents for CPE. He has Atypical migraine; Hypertension; Medication management; Vitamin D deficiency; Verruca; Morbid obesity with BMI of 40.0-44.9, adult (HCC); and Elevated LFTs on their problem list.  He is engaged and getting married in November. She is off of birth control, receptive to a child together. He is now an Personnel officer, good job. No concerns. 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   He got poison ivy on his arm last week.  It continues to spread on his arms, chest and abdomen.   He has a history of atypical migraine in the past and has seen Dr. Sharene Skeans, ped neuro in 2017.  In 2017, he had left sided hemiplegia and hemiparesis with a headache, had negative head CT, CT angio neck/head, and MRI/MRA. Negative coagulation study and thrombophilia work up at that time, determined to be a complex migraine. Normal echo. He reports improved with reduced caffeine, typically 1-2 per year. Will resolve with rest. Has not had for about 18 months.   BMI is Body mass index is 39.95 kg/m., he has been working on diet and exercise. He does eat more fried foods and fast food.  Due to job.  He is  currently walking 12000-15000 steps a day and goes to gym 3 times a week.  Wt Readings from Last 3 Encounters:  11/23/22 294 lb 9.6 oz (133.6 kg)  08/18/21 295 lb 3.2 oz (133.9 kg)  11/25/20 276 lb (125.2 kg)    His blood pressure has been controlled at home, stopped losartan 18 months ago and BP has been normal at home 120-130 70-80, Today their BP is BP: (!) 130/92 BP Readings from Last 3 Encounters:  11/23/22 (!) 130/92  08/16/22 (!) 156/102  08/18/21 120/84  He does workout. He denies chest pain, shortness of breath, dizziness.   He is not on cholesterol medication and denies myalgias. The cholesterol last visit was:   Lab Results  Component Value Date   CHOL 133 04/29/2019   HDL 46 04/29/2019   LDLCALC 68 04/29/2019   TRIG 108 04/29/2019   CHOLHDL 2.9 04/29/2019    He has been working on diet and exercise for glucose management, and denies increased appetite, nausea, paresthesia of the feet, polydipsia, polyuria and visual disturbances. Last A1C in the office was:  Lab Results  Component Value Date   HGBA1C 5.1 04/29/2019   Patient is on Vitamin D supplement, he is taking 5000 IU   Lab Results  Component Value Date   VD25OH 57 08/18/2021     Lab Results  Component Value Date   WBC 7.6 08/18/2021   HGB 15.9 08/18/2021   HCT 46.0 08/18/2021   MCV 86.5 08/18/2021   PLT 284 08/18/2021   Currently off iron supplement. History of iron deficiency Last check was: Lab Results  Component Value Date   IRON 118 08/18/2021   TIBC 324 08/18/2021   FERRITIN 211 08/18/2021    Current Medications:  Current Outpatient Medications on File Prior to Visit  Medication Sig Dispense Refill   Ascorbic Acid (VITAMIN C) 500 MG CAPS Take by mouth.     Magnesium 200 MG TABS Take by mouth.     Multiple Vitamin (MULTIVITAMIN) tablet Take by mouth daily.     Probiotic Product (PROBIOTIC PO) Take by mouth.     VITAMIN D PO Take 5,000 Units by mouth.     vitamin E 1000 UNIT capsule Take  268 Units by mouth daily.     Zinc 30 MG TABS Take 50 mg by mouth.     Black Pepper-Turmeric 3-500 MG CAPS Take by mouth. (Patient not taking: Reported on 11/23/2022)     cetirizine (ZYRTEC ALLERGY) 10 MG tablet Take 1 tablet (10 mg total) by mouth daily. (Patient not taking: Reported on 11/23/2022) 30 tablet 0   Garlic 300 MG TABS  Take by mouth. (Patient not taking: Reported on 11/23/2022)     ipratropium (ATROVENT) 0.03 % nasal spray Place 2 sprays into both nostrils 2 (two) times daily. (Patient not taking: Reported on 11/23/2022) 30 mL 0   losartan (COZAAR) 25 MG tablet Take 1 tablet (25 mg total) by mouth daily. 90 tablet 3   predniSONE (DELTASONE) 10 MG tablet Take 3 tablets (30 mg total) by mouth daily with breakfast. (Patient not taking: Reported on 11/23/2022) 15 tablet 0   QUERCETIN PO Take 500 mg by mouth daily. (Patient not taking: Reported on 11/23/2022)     No current facility-administered medications on file prior to visit.   Allergies:  Allergies  Allergen Reactions   Zoster Vaccine Recombinant, Adjuvanted Swelling    Chicken pox vaccine   Penicillins Rash   Health Maintenance:  Immunization History  Administered Date(s) Administered   DTaP 05/02/1999, 07/03/1999, 03/01/2000, 05/16/2000, 09/05/2004   HIB (PRP-OMP) 03/17/1999, 06/02/1999, 12/29/1999   HPV Quadrivalent 12/10/2012   Hepatitis A 11/28/2007, 02/03/2009   Hepatitis B 03/17/1999, 06/02/1999, 12/29/1999   IPV 03/02/1999, 05/02/1999, 07/03/1999, 09/05/2004   MMR 12/29/1999, 09/05/2004   Pneumococcal Conjugate-13 05/16/2000   Pneumococcal-Unspecified 03/17/1999, 06/02/1999   Tdap 02/03/2009, 04/29/2019   Varicella 12/29/1999, 11/28/2007   Health Maintenance  Topic Date Due   COVID-19 Vaccine (1) Never done   INFLUENZA VACCINE  11/01/2022   DTaP/Tdap/Td (7 - Td or Tdap) 04/28/2029   HPV VACCINES  Discontinued   Hepatitis C Screening  Discontinued   HIV Screening  Discontinued   Health Maintenance  Topic  Date Due   COVID-19 Vaccine (1) Never done   INFLUENZA VACCINE  11/01/2022   DTaP/Tdap/Td (7 - Td or Tdap) 04/28/2029   HPV VACCINES  Discontinued   Hepatitis C Screening  Discontinued   HIV Screening  Discontinued   Flu vaccine: declines  Covid 19: declines  HPV: 2014  Eye Exam: [facility], last 2023, no contacts or glasses Dentist: Dr. Denyse Amass, goes q2m  Patient Care Team: Lucky Cowboy, MD as PCP - General (Internal Medicine)  Medical History:  has Atypical migraine; Hypertension; Medication management; Vitamin D deficiency; Verruca; Morbid obesity with BMI of 40.0-44.9, adult (HCC); and Elevated LFTs on their problem list. Surgical History:  He  has a past surgical history that includes Circumcision. Family History:  His family history includes CVA in his paternal grandmother; Cancer in his paternal grandfather and paternal grandmother; Cancer (age of onset: 56) in his paternal uncle; Dementia in his paternal grandmother; Gout in his father; Heart disease in his paternal grandmother; Hyperlipidemia in his father; Hypertension in his mother; Kidney disease in his paternal grandfather; Lung cancer in his maternal grandfather; Migraines in his mother; Stroke in his paternal grandfather. Social History:   reports that he has never smoked. He has never used smokeless tobacco. He reports that he does not currently use alcohol. He reports that he does not use drugs.  Review of Systems:  Review of Systems  Constitutional:  Negative for malaise/fatigue and weight loss.  HENT:  Negative for hearing loss and tinnitus.   Eyes:  Negative for blurred vision and double vision.  Respiratory:  Negative for cough, shortness of breath and wheezing.   Cardiovascular:  Negative for chest pain, palpitations, orthopnea, claudication and leg swelling.  Gastrointestinal:  Negative for abdominal pain, blood in stool, constipation, diarrhea, heartburn, melena, nausea and vomiting.   Genitourinary: Negative.   Musculoskeletal:  Negative for joint pain and myalgias.  Skin:  Positive  for rash (poison ivy).  Neurological:  Negative for dizziness, tingling, sensory change, weakness and headaches.  Endo/Heme/Allergies:  Negative for polydipsia.  Psychiatric/Behavioral: Negative.    All other systems reviewed and are negative.   Physical Exam: Estimated body mass index is 39.95 kg/m as calculated from the following:   Height as of this encounter: 6' (1.829 m).   Weight as of this encounter: 294 lb 9.6 oz (133.6 kg). BP (!) 130/92   Pulse 74   Temp 97.7 F (36.5 C)   Ht 6' (1.829 m)   Wt 294 lb 9.6 oz (133.6 kg)   SpO2 97%   BMI 39.95 kg/m  General Appearance: very pleasant obese male, in no apparent distress.  Eyes: PERRLA, EOMs, conjunctiva no swelling or erythema Sinuses: No Frontal/maxillary tenderness  ENT/Mouth: Ext aud canals clear, normal light reflex with TMs without erythema, bulging. Good dentition. No erythema, swelling, or exudate on post pharynx. Hearing normal.  Neck: Supple, thyroid normal. No bruits  Respiratory: Respiratory effort normal, BS equal bilaterally without rales, rhonchi, wheezing or stridor.  Cardio: RRR without murmurs, rubs or gallops. Brisk peripheral pulses without edema Chest: symmetric, with normal excursions and percussion.  Abdomen: Soft, obese, nontender, no guarding, rebound, hernias, masses, or organomegaly.  Lymphatics: Non tender without lymphadenopathy.  Genitourinary: no concerns, defer Musculoskeletal: Full ROM all peripheral extremities,5/5 strength, and normal gait.  Skin: Warm, dry - poison ivy patches on both arms and torso Neuro: Cranial nerves intact, reflexes equal bilaterally. Normal muscle tone, no cerebellar symptoms. Sensation intact.  Psych: Awake and oriented X 3, normal affect, Insight and Judgment appropriate.   EKG: WNL 2021, defer  Margy Sumler Hollie Salk, DNP, AGNP-C 10:09 AM [facility]

## 2022-11-23 ENCOUNTER — Ambulatory Visit (INDEPENDENT_AMBULATORY_CARE_PROVIDER_SITE_OTHER): Payer: BC Managed Care – PPO | Admitting: Nurse Practitioner

## 2022-11-23 ENCOUNTER — Encounter: Payer: Self-pay | Admitting: Nurse Practitioner

## 2022-11-23 VITALS — BP 130/92 | HR 74 | Temp 97.7°F | Ht 72.0 in | Wt 294.6 lb

## 2022-11-23 DIAGNOSIS — Z1329 Encounter for screening for other suspected endocrine disorder: Secondary | ICD-10-CM

## 2022-11-23 DIAGNOSIS — Z79899 Other long term (current) drug therapy: Secondary | ICD-10-CM

## 2022-11-23 DIAGNOSIS — I10 Essential (primary) hypertension: Secondary | ICD-10-CM

## 2022-11-23 DIAGNOSIS — Z0001 Encounter for general adult medical examination with abnormal findings: Secondary | ICD-10-CM

## 2022-11-23 DIAGNOSIS — L237 Allergic contact dermatitis due to plants, except food: Secondary | ICD-10-CM

## 2022-11-23 DIAGNOSIS — R7989 Other specified abnormal findings of blood chemistry: Secondary | ICD-10-CM

## 2022-11-23 DIAGNOSIS — D509 Iron deficiency anemia, unspecified: Secondary | ICD-10-CM

## 2022-11-23 DIAGNOSIS — Z130 Encounter for screening for diseases of the blood and blood-forming organs and certain disorders involving the immune mechanism: Secondary | ICD-10-CM

## 2022-11-23 DIAGNOSIS — Z13220 Encounter for screening for lipoid disorders: Secondary | ICD-10-CM

## 2022-11-23 DIAGNOSIS — Z0000 Encounter for general adult medical examination without abnormal findings: Secondary | ICD-10-CM

## 2022-11-23 DIAGNOSIS — Z1389 Encounter for screening for other disorder: Secondary | ICD-10-CM

## 2022-11-23 DIAGNOSIS — E559 Vitamin D deficiency, unspecified: Secondary | ICD-10-CM

## 2022-11-23 DIAGNOSIS — Z131 Encounter for screening for diabetes mellitus: Secondary | ICD-10-CM

## 2022-11-23 DIAGNOSIS — G43009 Migraine without aura, not intractable, without status migrainosus: Secondary | ICD-10-CM

## 2022-11-23 MED ORDER — PREDNISONE 20 MG PO TABS
ORAL_TABLET | ORAL | 0 refills | Status: AC
Start: 2022-11-23 — End: 2022-12-04

## 2022-11-23 NOTE — Patient Instructions (Signed)
Poison Ivy Dermatitis Poison ivy dermatitis is irritation and swelling (inflammation) of the skin caused by chemicals in the leaves of the poison ivy plant. The skin reaction often involves redness, blisters, and extreme itching. What are the causes? This condition is caused by a chemical (urushiol) found in the sap of the poison ivy plant. This chemical is sticky and can easily spread to people, animals, and objects. You can get poison ivy dermatitis by: Having direct contact with a poison ivy plant. Touching animals, other people, or objects that have come in contact with poison ivy and have the chemical on them. What increases the risk? This condition is more likely to develop in people who: Are outdoors often in wooded or marshy areas. Go outdoors without wearing protective clothing, such as closed shoes, long pants, and a long-sleeved shirt. What are the signs or symptoms? Symptoms of this condition include: Redness of the skin. Extreme itching. A rash that often includes bumps and blisters. The rash usually appears 48 hours after exposure, if you have been exposed before. If this is the first time you have been exposed, the rash may not appear until a week after exposure. Swelling. This may occur if the reaction is more severe. Symptoms usually last for 1-2 weeks. However, the first time you develop this condition, symptoms may last 3-4 weeks. How is this diagnosed? This condition may be diagnosed based on your symptoms and a physical exam. Your health care provider may also ask you about any recent outdoor activity. How is this treated? Treatment for this condition will vary depending on how severe it is. Treatment may include: Hydrocortisone cream or calamine lotion to relieve itching. Oatmeal baths to soothe the skin. Medicines, such as over-the-counter antihistamine tablets. Oral or injected steroid medicine, for more severe reactions. Follow these instructions at  home: Medicines Take or apply over-the-counter and prescription medicines only as told by your health care provider. Use hydrocortisone cream or calamine lotion as needed to soothe the skin and relieve itching. General instructions Do not scratch or rub your skin. Apply a cold, wet cloth (cold compress) to the affected areas or take baths in cool water. This will help with itching. Avoid hot baths and showers. Take oatmeal baths as needed. Use colloidal oatmeal. You can get this at your local pharmacy or grocery store. Follow the instructions on the packaging. Wash all clothes, bedsheets, towels, and blankets you were in contact with between your exposure and appearance of the rash. Check the affected area every day for signs of infection. Check for: More redness, swelling, or pain. Fluid or blood. Warmth. Pus or a bad smell. Keep all follow-up visits. Your health care provider may want to see how your skin is progressing with treatment. How is this prevented?  Learn to identify the poison ivy plant and avoid contact with the plant. This plant can be recognized by the number of leaves. Generally, poison ivy has three leaves with flowering branches on a single stem. The leaves are typically glossy, and they have jagged edges that come to a point. If you have been exposed to poison ivy, thoroughly wash with soap and water right away. You have about 30 minutes to remove the plant resin before it will cause the rash. Be sure to wash under your fingernails, because any plant resin there will continue to spread the rash. When hiking or camping, wear clothes that will help you to avoid skin exposure. This includes long pants, a long-sleeved shirt, long socks,   and hiking boots. You can also apply preventive lotion to your skin to help limit exposure. If you suspect that your clothes or outdoor gear came in contact with poison ivy, rinse them off outside with a garden hose before you bring them inside  your house. When doing yard work or gardening, wear gloves, long sleeves, long pants, and boots. Wash your garden tools and gloves if they come in contact with poison ivy. If you suspect that your pet has come into contact with poison ivy, wash them with pet shampoo and water. Make sure to wear gloves while washing your pet. Contact a health care provider if: You have open sores in the rash area. You have any signs of infection. You have redness that spreads beyond the rash area. You have a fever. You have a rash over a large area of your body. You have a rash on your eyes, mouth, or genitals. You have a rash that does not improve after a few weeks. Get help right away if: Your face swells or your eyes swell shut. You have trouble breathing. You have trouble swallowing. These symptoms may be an emergency. Get help right away. Call 911. Do not wait to see if the symptoms will go away. Do not drive yourself to the hospital. This information is not intended to replace advice given to you by your health care provider. Make sure you discuss any questions you have with your health care provider. Document Revised: 08/17/2021 Document Reviewed: 08/17/2021 [employer] Patient Education  2024 [employer]  

## 2022-11-24 LAB — COMPLETE METABOLIC PANEL WITH GFR
AG Ratio: 1.5 (calc) (ref 1.0–2.5)
ALT: 67 U/L — ABNORMAL HIGH (ref 9–46)
AST: 33 U/L (ref 10–40)
Albumin: 4.6 g/dL (ref 3.6–5.1)
Alkaline phosphatase (APISO): 68 U/L (ref 36–130)
BUN: 11 mg/dL (ref 7–25)
CO2: 26 mmol/L (ref 20–32)
Calcium: 9.5 mg/dL (ref 8.6–10.3)
Chloride: 105 mmol/L (ref 98–110)
Creat: 0.82 mg/dL (ref 0.60–1.24)
Globulin: 3 g/dL (ref 1.9–3.7)
Glucose, Bld: 98 mg/dL (ref 65–99)
Potassium: 4.1 mmol/L (ref 3.5–5.3)
Sodium: 140 mmol/L (ref 135–146)
Total Bilirubin: 0.7 mg/dL (ref 0.2–1.2)
Total Protein: 7.6 g/dL (ref 6.1–8.1)
eGFR: 127 mL/min/{1.73_m2} (ref >=60)

## 2022-11-24 LAB — CBC WITH DIFFERENTIAL/PLATELET
Absolute Monocytes: 608 {cells}/uL (ref 200–950)
Basophils Absolute: 68 {cells}/uL (ref 0–200)
Basophils Relative: 0.9 %
Eosinophils Absolute: 182 {cells}/uL (ref 15–500)
Eosinophils Relative: 2.4 %
HCT: 47.7 % (ref 38.5–50.0)
Hemoglobin: 16.7 g/dL (ref 13.2–17.1)
Lymphs Abs: 2257 {cells}/uL (ref 850–3900)
MCH: 30.4 pg (ref 27.0–33.0)
MCHC: 35 g/dL (ref 32.0–36.0)
MCV: 86.7 fL (ref 80.0–100.0)
MPV: 10.9 fL (ref 7.5–12.5)
Monocytes Relative: 8 %
Neutro Abs: 4484 cells/uL (ref 1500–7800)
Neutrophils Relative %: 59 %
Platelets: 298 10*3/uL (ref 140–400)
RBC: 5.5 10*6/uL (ref 4.20–5.80)
RDW: 12.6 % (ref 11.0–15.0)
Total Lymphocyte: 29.7 %
WBC: 7.6 10*3/uL (ref 3.8–10.8)

## 2022-11-24 LAB — IRON,TIBC AND FERRITIN PANEL
%SAT: 28 % (ref 20–48)
Ferritin: 214 ng/mL (ref 38–380)
Iron: 94 ug/dL (ref 50–195)
TIBC: 333 ug/dL (ref 250–425)

## 2022-11-24 LAB — URINALYSIS, ROUTINE W REFLEX MICROSCOPIC
Bilirubin Urine: NEGATIVE
Glucose, UA: NEGATIVE
Hgb urine dipstick: NEGATIVE
Ketones, ur: NEGATIVE
Leukocytes,Ua: NEGATIVE
Nitrite: NEGATIVE
Protein, ur: NEGATIVE
Specific Gravity, Urine: 1.012 (ref 1.001–1.035)
pH: 7.5 (ref 5.0–8.0)

## 2022-11-24 LAB — VITAMIN D 25 HYDROXY (VIT D DEFICIENCY, FRACTURES): Vit D, 25-Hydroxy: 44 ng/mL (ref 30–100)

## 2022-11-24 LAB — HEMOGLOBIN A1C W/OUT EAG: Hgb A1c MFr Bld: 5.5 %{Hb} (ref <5.7)

## 2022-11-24 LAB — MAGNESIUM: Magnesium: 2 mg/dL (ref 1.5–2.5)

## 2022-11-24 LAB — LIPID PANEL
Cholesterol: 119 mg/dL (ref <200)
HDL: 44 mg/dL (ref >=40)
LDL Cholesterol (Calc): 60 mg/dL
Non-HDL Cholesterol (Calc): 75 mg/dL (ref <130)
Total CHOL/HDL Ratio: 2.7 (calc) (ref <5.0)
Triglycerides: 74 mg/dL (ref <150)

## 2022-11-24 LAB — TSH: TSH: 4.47 m[IU]/L (ref 0.40–4.50)

## 2023-05-14 ENCOUNTER — Encounter: Payer: Self-pay | Admitting: *Deleted

## 2023-11-25 ENCOUNTER — Encounter: Payer: BC Managed Care – PPO | Admitting: Nurse Practitioner
